# Patient Record
Sex: Female | Born: 1975 | Race: Black or African American | Hispanic: No | Marital: Single | Smoking: Current every day smoker
Health system: Southern US, Community
[De-identification: ages and names within clinical notes are randomized; demographics above are authoritative.]

## PROBLEM LIST (undated history)

## (undated) DIAGNOSIS — I10 Essential (primary) hypertension: Secondary | ICD-10-CM

---

## 2000-11-15 ENCOUNTER — Emergency Department (HOSPITAL_COMMUNITY): Admission: EM | Admit: 2000-11-15 | Discharge: 2000-11-15 | Payer: Self-pay | Admitting: Emergency Medicine

## 2001-08-08 ENCOUNTER — Emergency Department (HOSPITAL_COMMUNITY): Admission: EM | Admit: 2001-08-08 | Discharge: 2001-08-08 | Payer: Self-pay | Admitting: Emergency Medicine

## 2001-12-18 ENCOUNTER — Emergency Department (HOSPITAL_COMMUNITY): Admission: EM | Admit: 2001-12-18 | Discharge: 2001-12-18 | Payer: Self-pay

## 2003-06-06 ENCOUNTER — Emergency Department (HOSPITAL_COMMUNITY): Admission: EM | Admit: 2003-06-06 | Discharge: 2003-06-07 | Payer: Self-pay | Admitting: Emergency Medicine

## 2004-05-14 ENCOUNTER — Emergency Department (HOSPITAL_COMMUNITY): Admission: EM | Admit: 2004-05-14 | Discharge: 2004-05-14 | Payer: Self-pay | Admitting: Emergency Medicine

## 2004-07-24 ENCOUNTER — Inpatient Hospital Stay (HOSPITAL_COMMUNITY): Admission: AD | Admit: 2004-07-24 | Discharge: 2004-07-25 | Payer: Self-pay | Admitting: Obstetrics and Gynecology

## 2005-04-20 ENCOUNTER — Ambulatory Visit (HOSPITAL_COMMUNITY): Admission: RE | Admit: 2005-04-20 | Discharge: 2005-04-20 | Payer: Self-pay | Admitting: *Deleted

## 2005-05-04 ENCOUNTER — Ambulatory Visit: Payer: Self-pay | Admitting: *Deleted

## 2005-05-26 ENCOUNTER — Ambulatory Visit: Payer: Self-pay | Admitting: Gynecology

## 2005-06-23 ENCOUNTER — Ambulatory Visit: Payer: Self-pay | Admitting: Gynecology

## 2005-06-30 ENCOUNTER — Ambulatory Visit (HOSPITAL_COMMUNITY): Admission: RE | Admit: 2005-06-30 | Discharge: 2005-06-30 | Payer: Self-pay | Admitting: *Deleted

## 2005-08-05 ENCOUNTER — Inpatient Hospital Stay (HOSPITAL_COMMUNITY): Admission: AD | Admit: 2005-08-05 | Discharge: 2005-08-08 | Payer: Self-pay | Admitting: Obstetrics & Gynecology

## 2005-08-05 ENCOUNTER — Ambulatory Visit: Payer: Self-pay | Admitting: Obstetrics & Gynecology

## 2005-08-05 ENCOUNTER — Encounter (INDEPENDENT_AMBULATORY_CARE_PROVIDER_SITE_OTHER): Payer: Self-pay | Admitting: *Deleted

## 2005-08-11 ENCOUNTER — Ambulatory Visit: Payer: Self-pay | Admitting: Obstetrics and Gynecology

## 2006-12-23 ENCOUNTER — Emergency Department (HOSPITAL_COMMUNITY): Admission: EM | Admit: 2006-12-23 | Discharge: 2006-12-23 | Payer: Self-pay | Admitting: Emergency Medicine

## 2007-04-15 IMAGING — US US OB COMP LESS 14 WK
1 series · 14 of 19 positions shown · non-contrast
Comparison: none

CLINICAL DATA: 29 year old female 13 weeks pregnant with vaginal bleeding.  
 OBSTETRICAL ULTRASOUND < 14 WEEKS: 
 No comparison. 

  Number of Fetuses:  1
 Heart Rate:  157 bpm
 CRL:  7.5 cm 
 Fetal anatomy could not be evaluated due to the early gestational age.
 MATERNAL UTERINE AND ADNEXAL FINDINGS
 Cervix:  3.4 cm transabdominally.  
 There is an anterior uterine fibroid measuring 7 x 4 x 6 cm.

[Series 1: us ob comp less 14 wk · 0.33mm/px · 14 of 19 slices shown]
[im 1/19]
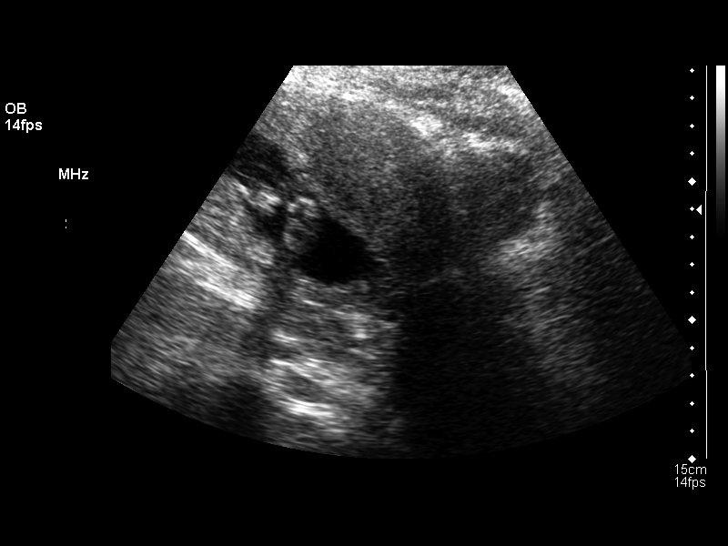
[im 3/19]
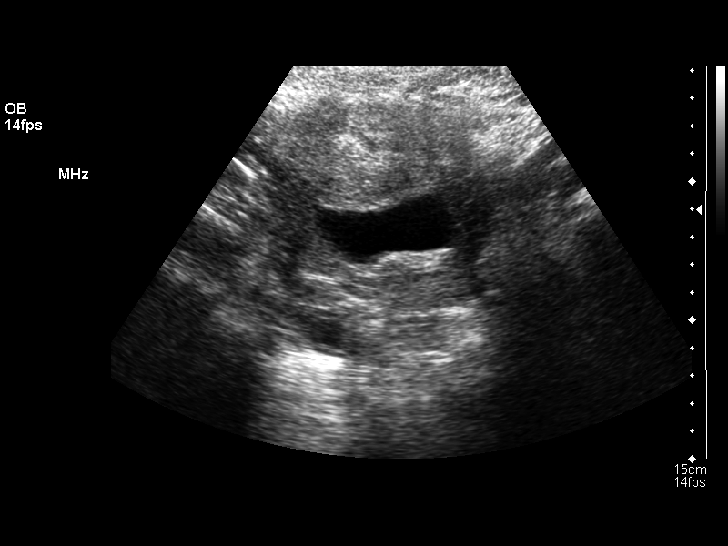
[im 4/19]
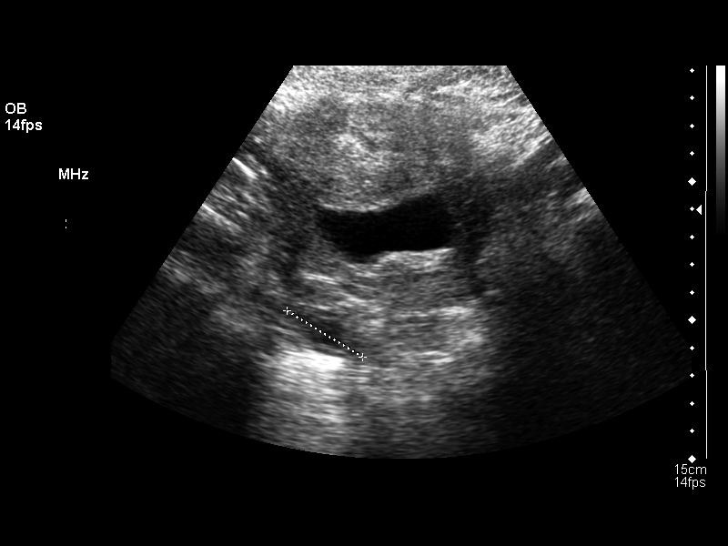
[im 5/19]
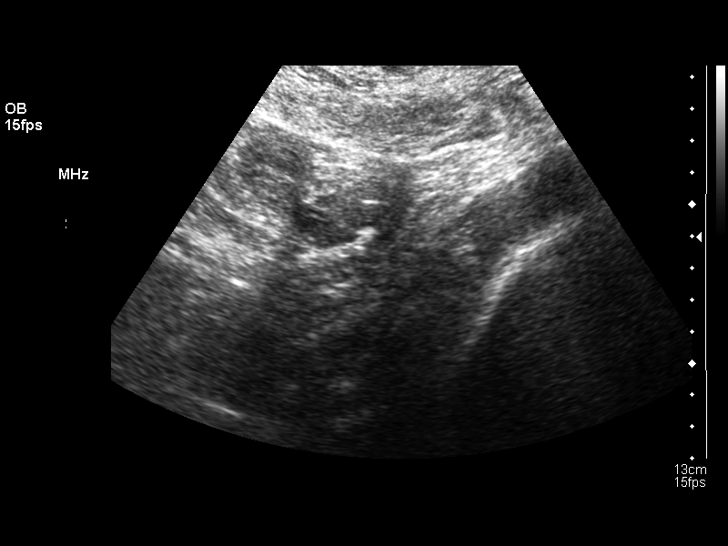
[im 7/19]
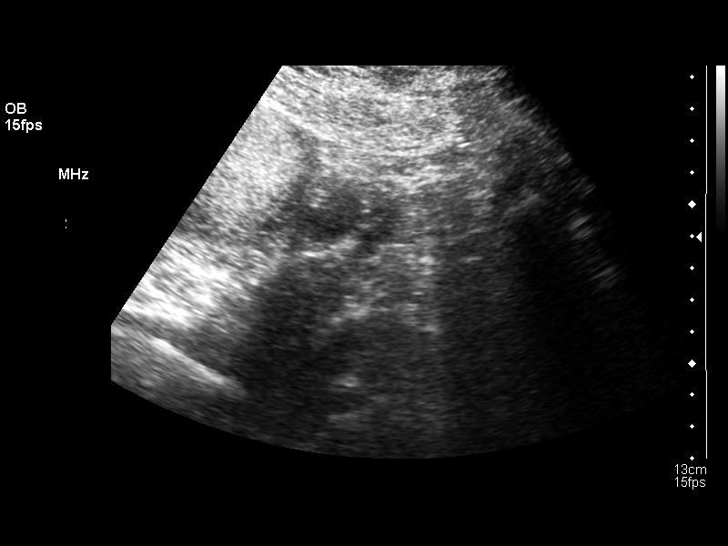
[im 8/19]
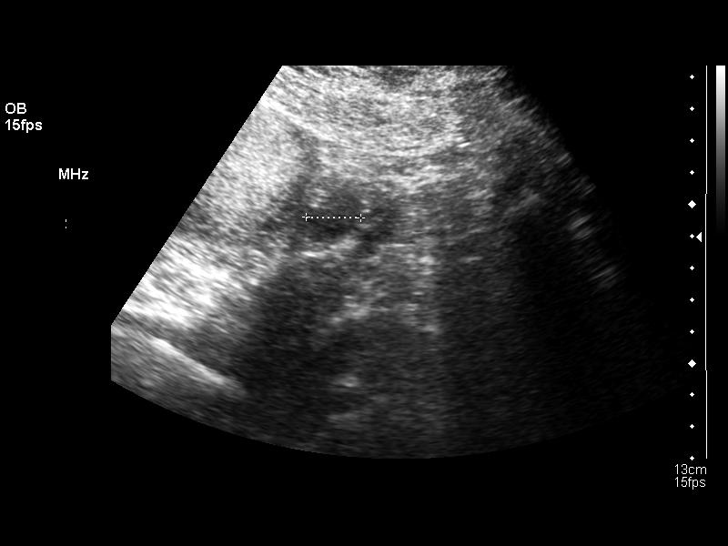
[im 9/19]
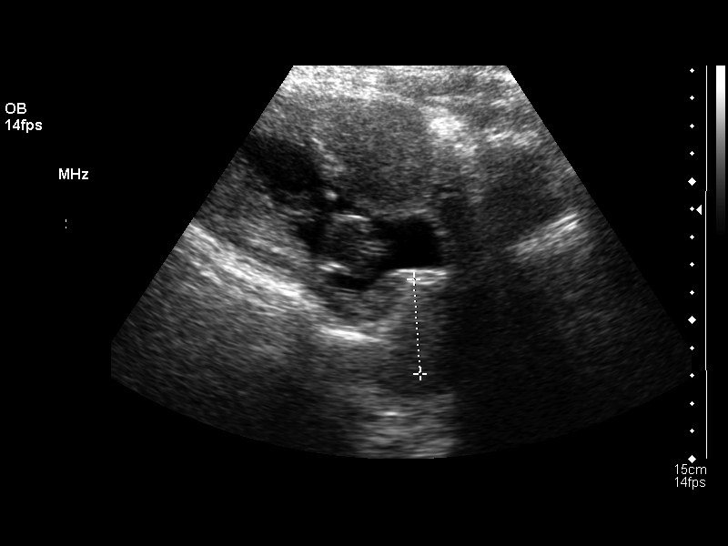
[im 11/19]
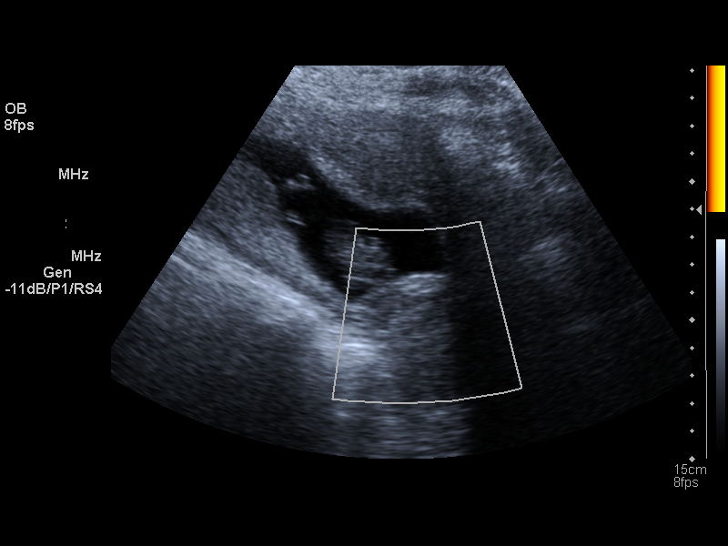
[im 12/19]
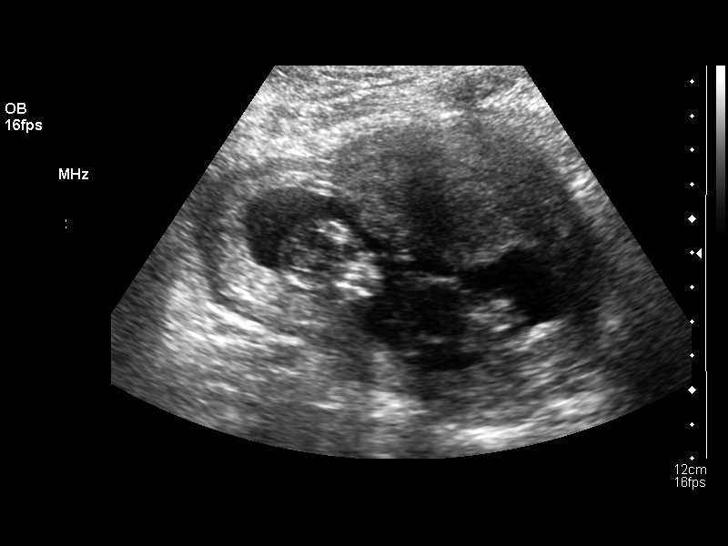
[im 13/19]
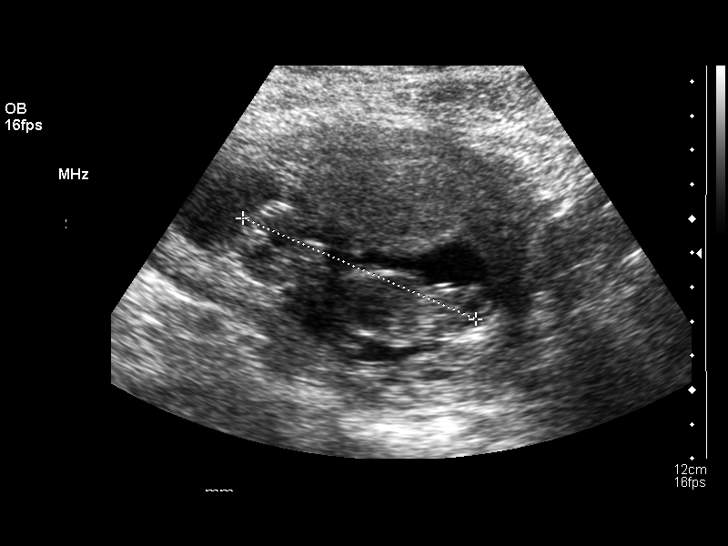
[im 15/19]
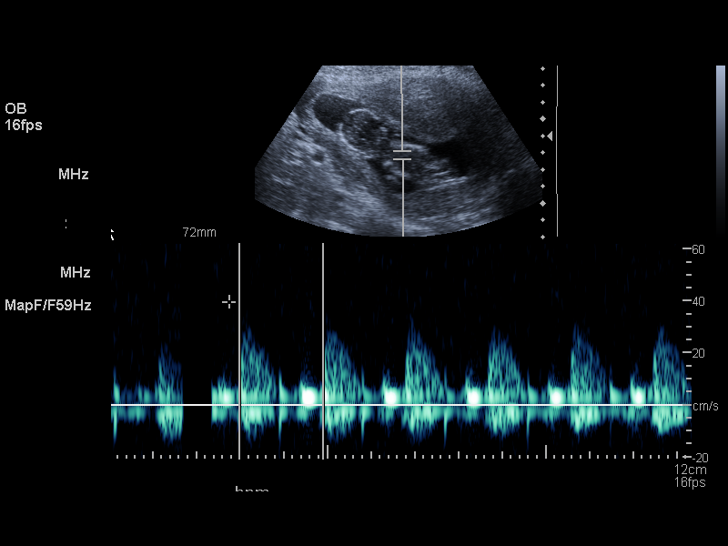
[im 16/19]
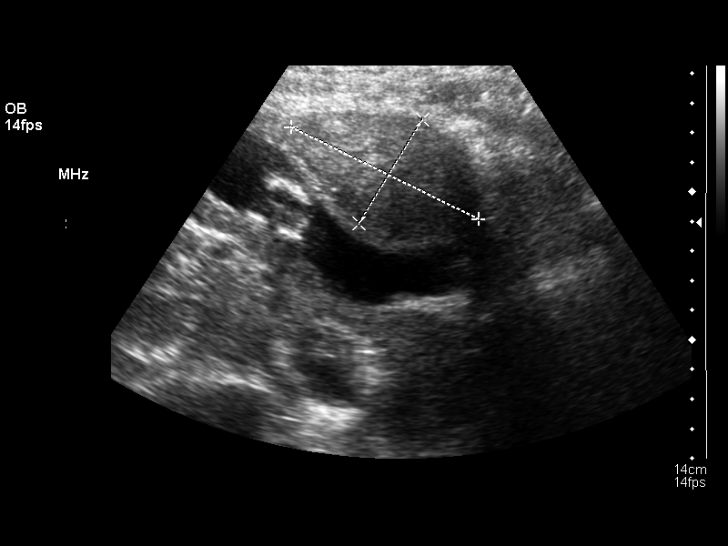
[im 17/19]
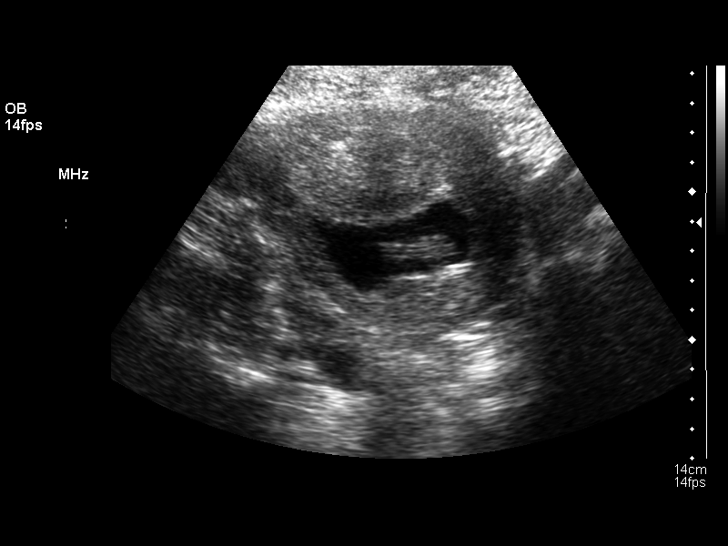
[im 19/19]
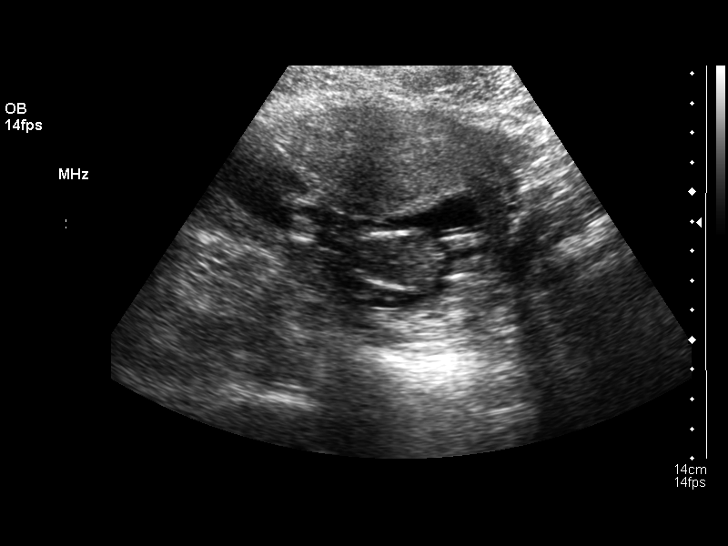

[14 of 19 positions shown; findings below may reference images not displayed]

IMPRESSION: 1.  Viable 13 week, 6 day intrauterine pregnancy.

2.  Anterior uterine fibroid.

3.  Closed cervix with a 3.4 cm length transabdominally.

## 2008-03-20 IMAGING — US US OB FOLLOW-UP EACH ADDL GEST (MODIFY)
2 series · 13 of 28 positions shown · non-contrast
Comparison: none

CLINICAL DATA: Twin gestation with assigned gestational age of 33 weeks 2 days.  Measuring large for dates.  Evaluate twin growth and amniotic fluid volume.

[Series 1: us ob follow-up each addl gest (modify) · 0.35mm/px · 11 of 63 slices shown (1 of 2)]
[im 3/63]
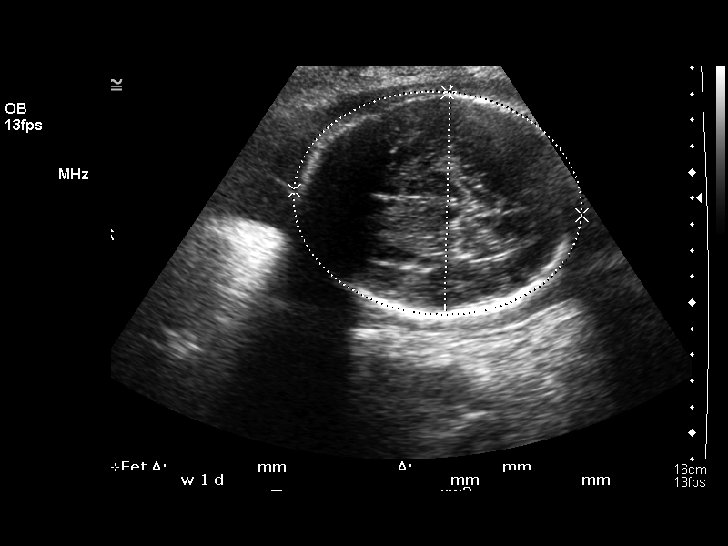
[im 9/63]
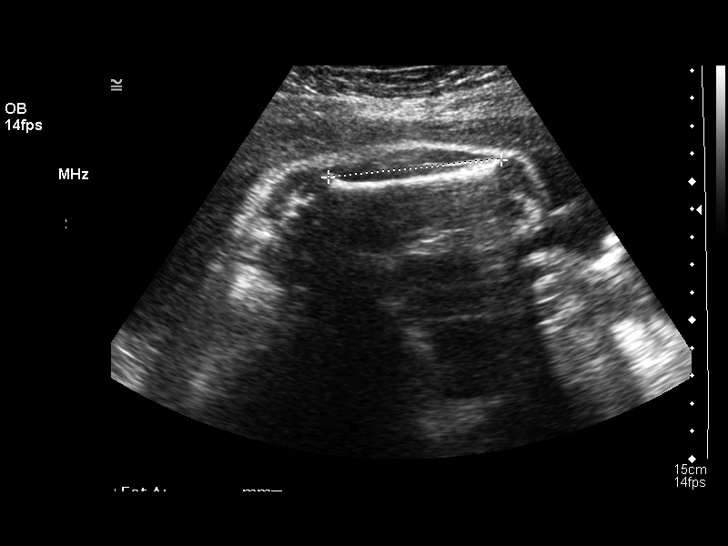
[im 14/63]
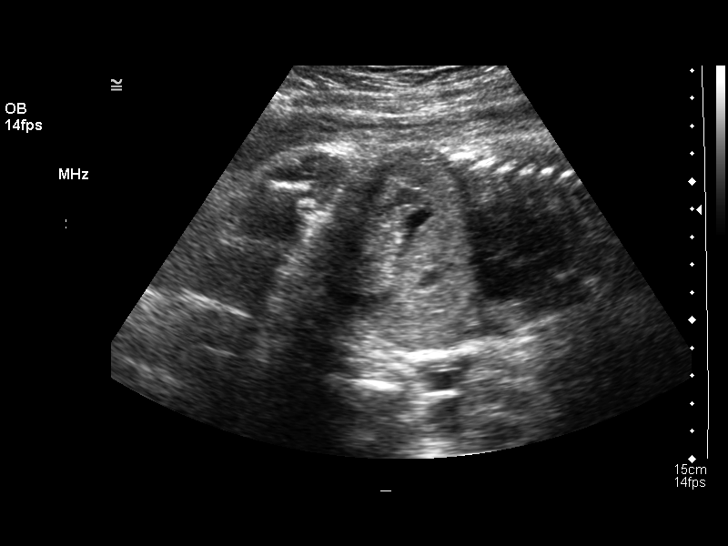
[im 19/63]
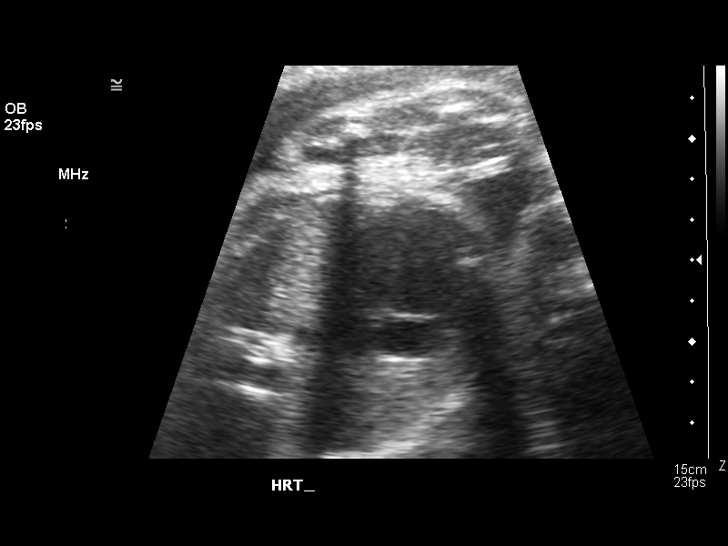
[im 25/63]
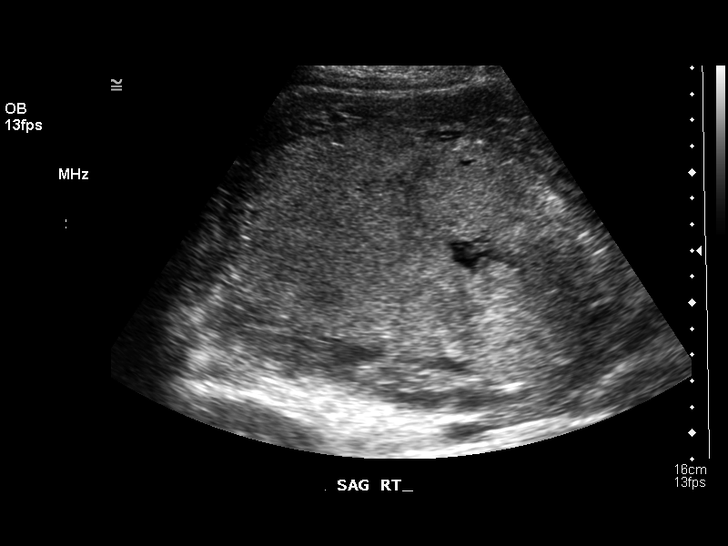
[im 30/63]
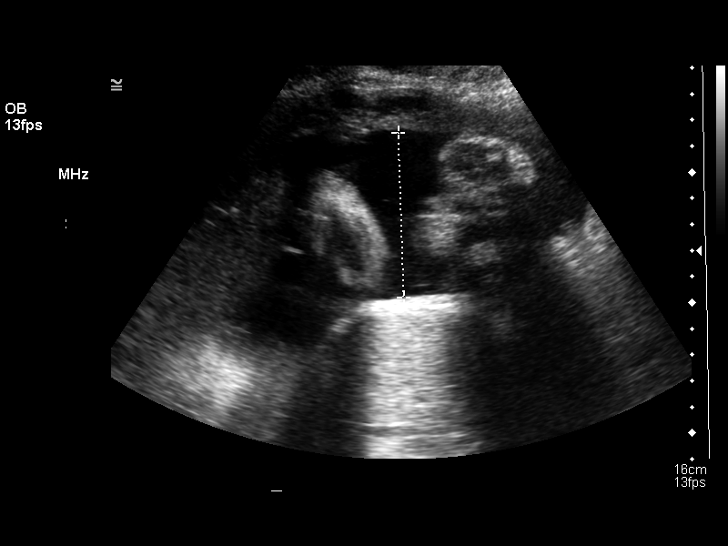
[im 38/63]
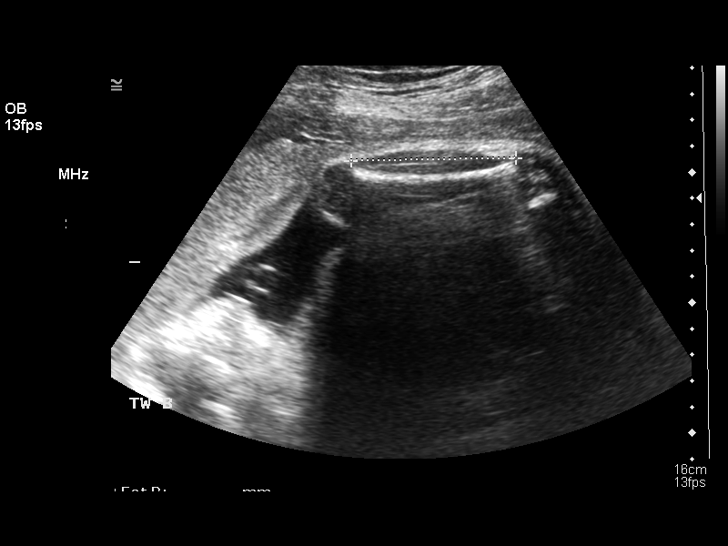
[im 44/63]
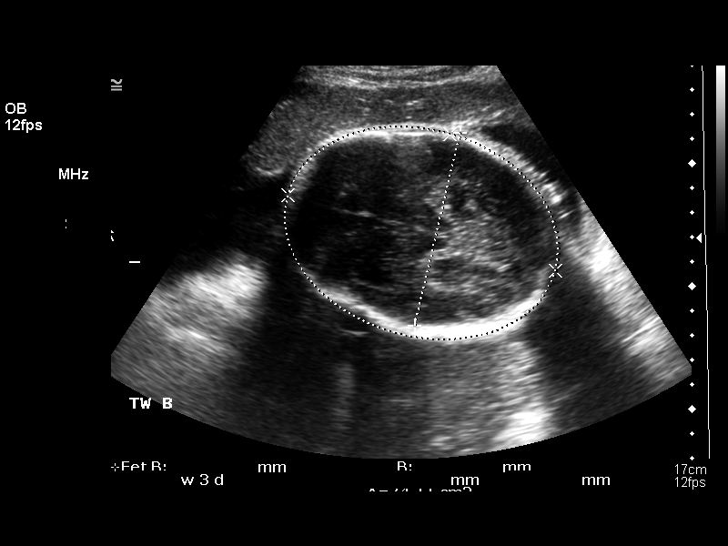
[im 49/63]
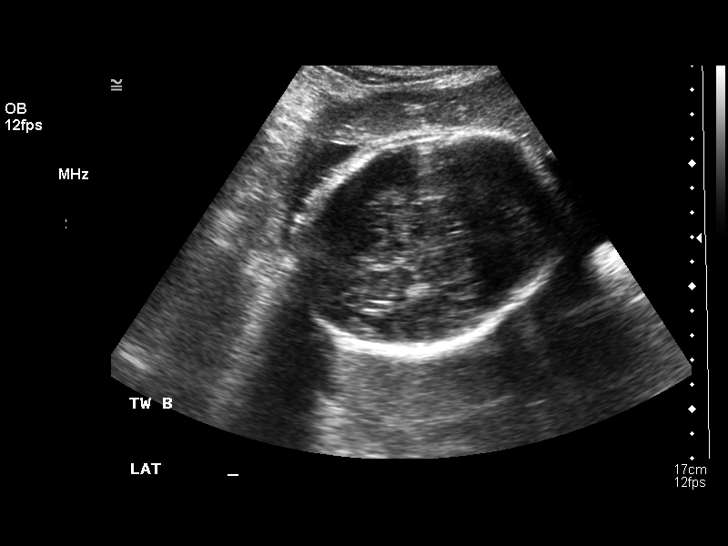
[im 54/63]
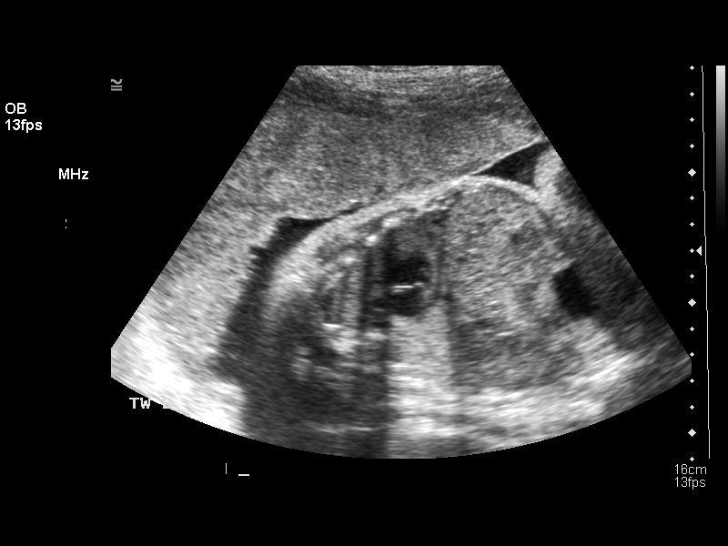
[im 60/63]
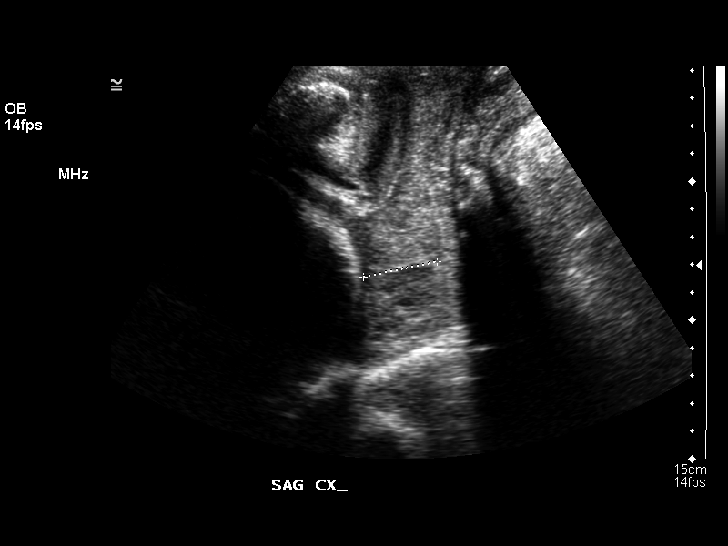

[Series 1: us ob follow-up each addl gest (modify) · 0.35mm/px · 2 of 9 slices shown (2 of 2)]
[im 1/9]
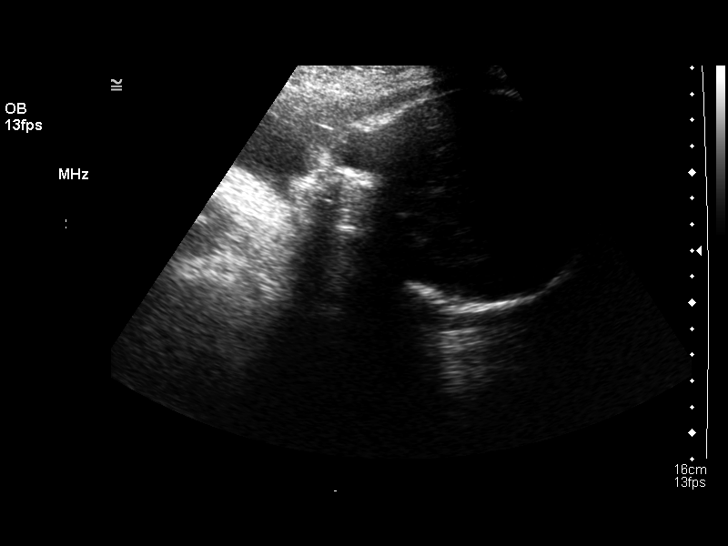
[im 6/9]
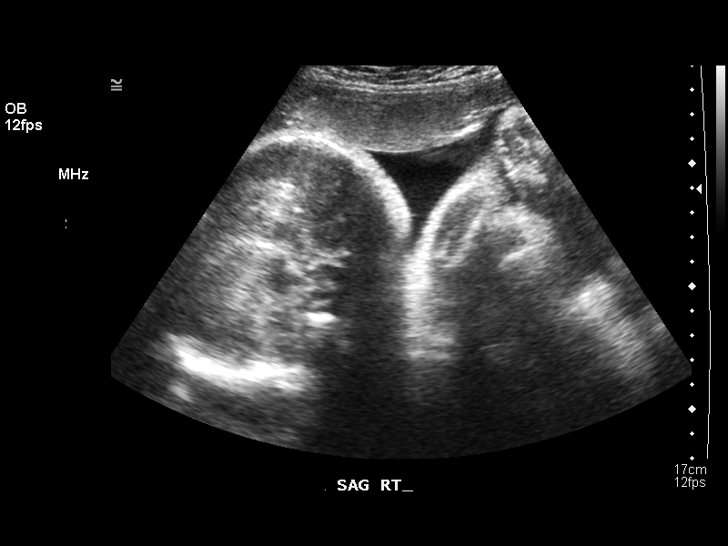

[13 of 28 positions shown; findings below may reference images not displayed]

TWIN OBSTETRICAL ULTRASOUND REEVALUATION:
 A living twin gestation is present.  A separating membrane is seen.

 TWIN A:
 Heart Rate: 157
 Movement:  Yes
 Breathing:  Yes
 Presentation:  Cephalic on the maternal right side
 Placental Location:  Posterior, right lateral 
 Grade:  II
 Previa:  No
 Amniotic Fluid (Subjective):  Normal
 Amniotic Fluid (Objective):   6.3 cm Vertical pocket 

 FETAL BIOMETRY (TWIN A)
 BPD:  8.5 cm  34 w 2 d
 HC:  30.7 cm   34 w 2 d
 AC:   27.6 cm   31 w 5 d
 FL:   6.1 cm   31 w 6 d

 Mean GA:  33 w 0 d  US EDC:  08/18/05
 Assigned GA:  33 w 2 d  Assigned EDC:  08/16/05
 EFW:  3536 g (H) 50th ? 75th%ile (9497 ? 1764 g) For 33 wks

 FETAL ANATOMY (TWIN A)
 Lateral Ventricles:  Visualized 
 Thalami/CSP:  Visualized 
 Posterior Fossa:  Visualized 
 Nuchal Region:  N/A
 Spine:  Previously seen 
 4 Chamber Heart on L:  Visualized 
 Stomach on Left:  Visualized 
 3 Vessel Cord:  Previously seen 
 Cord Insertion Site:  Previously seen 
 Kidneys:  Visualized 
 Bladder:  Visualized 
 Extremities:  Previously seen 

 Additional Anatomy Visualized:  LVOT, diaphragm, and male genitalia.
 TWIN B:
 Heart Rate:  160
 Movement:  Yes
 Breathing:  Yes
 Presentation:  Breech on the maternal left side
 Placental Location:  Anterior
 Grade:  II
 Previa:  No
 Amniotic Fluid (Subjective):  Normal
 Amniotic Fluid (Objective):   5.3 cm Vertical pocket 

 FETAL BIOMETRY (TWIN B)
 BPD:  8.2 cm   33 w 1 d
 HC:  31.3 cm   35 w 0 d
 AC:  28.3 cm   32 w 3 d
 FL:   6.4 cm   32 w 6 d

 Mean GA:  33 w 3 d  US EDC:  08/15/05
 Assigned GA:  33 w 2 d  Assigned EDC:  08/16/05
 EFW:  8483 g (H) 50th ? 75th%ile (9497 ? 1764 g) For 33 wks

 FETAL ANATOMY (TWIN B)
 Lateral Ventricles:  Visualized 
 Thalami/CSP:  Visualized 
 Posterior Fossa:  Previously seen 
 Nuchal Region:  N/A
 Spine:  Previously seen   
 4 Chamber Heart on L:  Visualized 
 Stomach on Left:  Visualized 
 3 Vessel Cord:  Previously seen 
 Cord Insertion Site:  Previously seen 
 Kidneys:  Visualized 
 Bladder:  Visualized 
 Extremities:  Previously seen 

 Additional Anatomy Visualized:  LVOT, RVOT, upper lip, orbits, diaphragm, and female genitalia.

 MATERNAL UTERINE AND ADNEXAL FINDINGS
 Cervix: 3.0 cm Translabially
IMPRESSION: 1.  Living twin gestation with assigned gestational age of 33 weeks 2 days.  Appropriate and concordant twin fetal growth, with EFW for both fetuses at the 50th ? 75th percentile. 
 2.  Normal amniotic fluid volume for both fetuses.  Normal cervical length.

## 2008-05-05 ENCOUNTER — Emergency Department (HOSPITAL_COMMUNITY): Admission: EM | Admit: 2008-05-05 | Discharge: 2008-05-05 | Payer: Self-pay | Admitting: Emergency Medicine

## 2008-05-31 ENCOUNTER — Emergency Department (HOSPITAL_COMMUNITY): Admission: EM | Admit: 2008-05-31 | Discharge: 2008-05-31 | Payer: Self-pay | Admitting: Family Medicine

## 2008-06-11 ENCOUNTER — Emergency Department (HOSPITAL_COMMUNITY): Admission: EM | Admit: 2008-06-11 | Discharge: 2008-06-11 | Payer: Self-pay | Admitting: Family Medicine

## 2008-07-01 ENCOUNTER — Emergency Department (HOSPITAL_COMMUNITY): Admission: EM | Admit: 2008-07-01 | Discharge: 2008-07-01 | Payer: Self-pay | Admitting: Emergency Medicine

## 2008-09-02 ENCOUNTER — Emergency Department (HOSPITAL_COMMUNITY): Admission: EM | Admit: 2008-09-02 | Discharge: 2008-09-02 | Payer: Self-pay | Admitting: Family Medicine

## 2009-05-12 ENCOUNTER — Emergency Department (HOSPITAL_COMMUNITY): Admission: EM | Admit: 2009-05-12 | Discharge: 2009-05-12 | Payer: Self-pay | Admitting: Family Medicine

## 2010-06-21 LAB — POCT I-STAT, CHEM 8
Chloride: 104 mEq/L (ref 96–112)
Hemoglobin: 11.6 g/dL — ABNORMAL LOW (ref 12.0–15.0)
Potassium: 3.7 mEq/L (ref 3.5–5.1)

## 2010-07-30 NOTE — Discharge Summary (Signed)
Jennifer Maldonado, Jennifer Maldonado             ACCOUNT NO.:  [id number]   MEDICAL RECORD NO.:  [id number]          PATIENT TYPE:  INP   LOCATION:  9133                          FACILITY:  [facility]   PHYSICIAN:  Lesly Dukes, M.D. DATE OF BIRTH:  1975/12/21   DATE OF ADMISSION:  08/05/2005  DATE OF DISCHARGE:  08/08/2005                                 DISCHARGE SUMMARY   PRIMARY DISCHARGE DIAGNOSES:  Low cervical transverse cesarean section  secondary to twins.   This is a 35 year old G6, P2-0-3-4 postoperative day three for a repeat low  transverse cesarean section and bilateral tubal ligation.  Is being  discharged home.  The patient tolerated the procedure well and was doing  fine during the postoperative period.  She did not have any postoperative  fever or any signs of infection at time of discharge.   During the procedure patient was given spinal anesthesia.  Two three-vessel  cords were delivered through manual extraction with estimated blood loss of  approximately 500 mL.  The Apgars for baby 1 was 4 at one minute and 8 at  five minutes, for baby 2 was 4 at one minute and 8 at five minutes.  Both  babies were delivered at term at approximately 38-3/7.  Mom is bottle  feeding.  Birth control is bilateral tubal.  Mom's blood type is A+.  She is  rubella non-immune, but will be immunized before she leaves.   The patient's pain has been well controlled, will be sent home with prenatal  vitamins and ibuprofen.  She will return to the MAU approximately six days  after her procedure for complete removal of her staples.     ______________________________  Djibril Glogowski    ______________________________  Lesly Dukes, M.D.    Hadley Pen  D:  08/08/2005  T:  08/08/2005  Job:  [id number]

## 2010-07-30 NOTE — Op Note (Signed)
Jennifer Maldonado, Jennifer Maldonado             ACCOUNT NO.:  [id number]   MEDICAL RECORD NO.:  [id number]          PATIENT TYPE:  INP   LOCATION:  9133                          FACILITY:  [facility]   PHYSICIAN:  Lesly Dukes, M.D. DATE OF BIRTH:  1975/07/03   DATE OF PROCEDURE:  08/05/2005  DATE OF DISCHARGE:                                 OPERATIVE REPORT   PREOPERATIVE DIAGNOSIS:  The patient is a 35 year old G6 P2-0-3-2 at 9  weeks' and 3 days' estimated gestational age with twin IUP and history of 2  prior C-sections as well as multiparity desiring sterility as well as  limited prenatal care.   POSTOPERATIVE DIAGNOSIS:  The patient is a 35 year old G6 P2-0-3-2 at 61  weeks' and 3 days' estimated gestational age with twin IUP and history of 2  prior C-sections as well as multiparity desiring sterility as well as  limited prenatal care.   PROCEDURE:  A repeat low flap transverse cesarean section and bilateral  tubal ligation.   SURGEON:  Dr. Elsie Lincoln   ASSISTANT:  Dr. Tinnie Gens and Dr. Kathlyn Sacramento   ANESTHESIA:  Spinal.   PATHOLOGY:  Placenta.   ESTIMATED BLOOD LOSS:  800.   COMPLICATIONS:  None.   PROCEDURE:  After informed consent was obtained, the patient was taken to  the operating room where spinal anesthesia was found to be adequate.  The  patient was placed in dorsal supine position with a leftward tilt and  prepared and draped in the normal sterile fashion.  A Foley was in the  bladder.  A Pfannenstiel skin incision was made with the scalpel and carried  down to the fascia.  The fascia was incised in the midline and extended  bilaterally.  The superior and inferior aspects of the fascial incision were  grasped with Kocher clamps, tented up, and dissected off sharply and bluntly  from the underlying layers of rectus muscles.  The rectus muscles were  separated in the midline.  The peritoneum was entered bluntly and extended  superiorly and inferiorly with good  visualization of the bladder.  The  bladder blade was inserted.  Attempt was made at making a bladder flap, but  there were a lot of adhesions involving the lower uterine segment and the  bladder.  The uterine segment was then incised with the scalpel in a  transverse fashion, and the incision was extended bluntly.  The head  delivered easily.  Thick meconium was noted and DeLee suction of the nose  and mouth was done.  The rest of the baby's body delivered easily.  The cord  was clamped, cut, and the baby was handed off to the pediatrician.  Cord  blood was sent for type and screen.  Arterial and venous core __________were  sent.  Baby B also delivered vertex, and there was clear fluid.  This was a  baby girl.  Arterial venous cord gases were also sent.  The placenta  delivered manually, was sent to pathology.  The uterus was exteriorized and  cleared of all clots and debris.  The uterine incision was closed with  0  Vicryl in a running locked fashion, and good hemostasis was noted.  Even  there was no bladder flap __________, the bladder was safely out of harm's  way during the repair of the uterine incision.  The intra-abdominal cavity  was copiously irrigated, and the uterus was noted to be hemostatic all  tension inside the abdominal cavity.  The patient felt warm to touch at the  time of the C-section, and the anesthesiologist told us the patient had  100.0 degrees.  Gentamicin and clindamycin was given at this time.  The  patient was not ruptured at the time of C-section, so it is questionable the  etiology of this low-grade fever.  Pediatricians were also made aware that  cord gases were also sent which were noted to have respiratory acidosis.  Details of these cord gases are on the back of the immediate postprocedure  note.  Rectus muscles and peritoneum were noted to be hemostatic.  The  postpartum tubal ligation was performed using Filshie clips.  Good  hemostasis was noted.  The  fascia was closed with 0 Vicryl in a running  fashion.  Subcutaneous tissue was copiously irrigated and noted to be  hemostatic.  Skin was closed with staples.  The patient tolerated the  procedure well.  Sponge, lap, instrument, and needle count correct x2 and  patient went to the recovery room in stable condition.           ______________________________  Lesly Dukes, M.D.     KHL/MEDQ  D:  08/05/2005  T:  08/05/2005  Job:  [id number]

## 2010-12-23 LAB — POCT URINALYSIS DIP (DEVICE)
Bilirubin Urine: NEGATIVE
Glucose, UA: NEGATIVE
Ketones, ur: NEGATIVE
Nitrite: NEGATIVE
Specific Gravity, Urine: 1.015
pH: 8.5 — ABNORMAL HIGH

## 2010-12-23 LAB — GC/CHLAMYDIA PROBE AMP, GENITAL
Chlamydia, DNA Probe: NEGATIVE
GC Probe Amp, Genital: NEGATIVE

## 2010-12-23 LAB — WET PREP, GENITAL

## 2011-10-28 ENCOUNTER — Emergency Department (HOSPITAL_COMMUNITY)
Admission: EM | Admit: 2011-10-28 | Discharge: 2011-10-28 | Disposition: A | Discharge status: Home / Self Care | Payer: Self-pay | Attending: Emergency Medicine | Admitting: Emergency Medicine

## 2011-10-28 ENCOUNTER — Encounter (HOSPITAL_COMMUNITY): Payer: Self-pay | Admitting: *Deleted

## 2011-10-28 DIAGNOSIS — D649 Anemia, unspecified: Secondary | ICD-10-CM | POA: Insufficient documentation

## 2011-10-28 DIAGNOSIS — I10 Essential (primary) hypertension: Secondary | ICD-10-CM | POA: Insufficient documentation

## 2011-10-28 DIAGNOSIS — K649 Unspecified hemorrhoids: Secondary | ICD-10-CM | POA: Insufficient documentation

## 2011-10-28 HISTORY — DX: Essential (primary) hypertension: I10

## 2011-10-28 LAB — CBC
MCH: 21.1 pg — ABNORMAL LOW (ref 26.0–34.0)
MCV: 71.1 fL — ABNORMAL LOW (ref 78.0–100.0)
RBC: 4.54 MIL/uL (ref 3.87–5.11)
WBC: 7.8 10*3/uL (ref 4.0–10.5)

## 2011-10-28 LAB — COMPREHENSIVE METABOLIC PANEL
ALT: 13 U/L (ref 0–35)
AST: 19 U/L (ref 0–37)
Alkaline Phosphatase: 70 U/L (ref 39–117)
Calcium: 8.9 mg/dL (ref 8.4–10.5)
GFR calc Af Amer: >90 mL/min (ref >90)
Glucose, Bld: 82 mg/dL (ref 70–99)
Total Bilirubin: 0.2 mg/dL — ABNORMAL LOW (ref 0.3–1.2)

## 2011-10-28 LAB — OCCULT BLOOD X 1 CARD TO LAB, STOOL: Fecal Occult Bld: POSITIVE

## 2011-10-28 NOTE — ED Notes (Signed)
Pt reports rectal bleeding off and on x 3 weeks. States dark blood when wipes. Also reports out of BP medication.

## 2011-10-31 LAB — POCT I-STAT, CHEM 8
Sodium: 141 mEq/L (ref 135–145)
TCO2: 27 mmol/L (ref 0–100)

## 2011-11-01 NOTE — ED Provider Notes (Signed)
History     CSN: [id number]  Arrival date & time 10/28/11  1437   First MD Initiated Contact with Patient 10/28/11 1617      Chief Complaint  Patient presents with  . Rectal Bleeding     HPI Pt reports rectal bleeding off and on x 3 weeks. States dark blood when wipes. Also reports out of BP medication.  Past Medical History  Diagnosis Date  . Hypertension     History reviewed. No pertinent past surgical history.  No family history on file.  History  Substance Use Topics  . Smoking status: Never Smoker   . Smokeless tobacco: Not on file  . Alcohol Use: No    OB History    Grav Para Term Preterm Abortions TAB SAB Ect Mult Living                  Review of Systems  All other systems reviewed and are negative.    Allergies  Review of patient's allergies indicates no known allergies.  Home Medications   Current Outpatient Rx  Name Route Sig Dispense Refill  . CETIRIZINE HCL 10 MG PO TABS Oral Take 10 mg by mouth daily as needed. For allergies.    Marland Kitchen VILAZODONE HCL 40 MG PO TABS Oral Take 40 mg by mouth daily.      BP 142/93  Pulse 67  Temp 99.6 F (37.6 C) (Oral)  Resp 18  SpO2 99%  Physical Exam  Nursing note and vitals reviewed. Constitutional: She is oriented to person, place, and time. She appears well-developed. No distress.  HENT:  Head: Normocephalic and atraumatic.  Eyes: Pupils are equal, round, and reactive to light.  Neck: Normal range of motion.  Cardiovascular: Normal rate and intact distal pulses.   Pulmonary/Chest: No respiratory distress.  Abdominal: Normal appearance. She exhibits no distension.  Genitourinary:     Musculoskeletal: Normal range of motion.  Neurological: She is alert and oriented to person, place, and time. No cranial nerve deficit.  Skin: Skin is warm and dry. No rash noted.  Psychiatric: She has a normal mood and affect. Her behavior is normal.    ED Course  Procedures (including critical care  time)  Labs Reviewed  CBC - Abnormal; Notable for the following:    Hemoglobin 9.6 (*)     HCT 32.3 (*)     MCV 71.1 (*)     MCH 21.1 (*)     MCHC 29.7 (*)     RDW 17.1 (*)     All other components within normal limits  COMPREHENSIVE METABOLIC PANEL - Abnormal; Notable for the following:    Total Bilirubin 0.2 (*)     All other components within normal limits  POCT I-STAT, CHEM 8 - Abnormal; Notable for the following:    Calcium, Ion 1.29 (*)     All other components within normal limits  OCCULT BLOOD X 1 CARD TO LAB, STOOL  LAB REPORT - SCANNED   No results found.   1. Bleeding hemorrhoids   2. Anemia       MDM          Nelia Shi, MD 11/01/11 2259

## 2012-01-23 ENCOUNTER — Emergency Department (HOSPITAL_COMMUNITY)
Admission: EM | Admit: 2012-01-23 | Discharge: 2012-01-23 | Disposition: A | Discharge status: Home / Self Care | Payer: Medicaid Other | Attending: Emergency Medicine | Admitting: Emergency Medicine

## 2012-01-23 ENCOUNTER — Encounter (HOSPITAL_COMMUNITY): Payer: Self-pay | Admitting: Emergency Medicine

## 2012-01-23 DIAGNOSIS — Z79899 Other long term (current) drug therapy: Secondary | ICD-10-CM | POA: Insufficient documentation

## 2012-01-23 DIAGNOSIS — R51 Headache: Secondary | ICD-10-CM | POA: Insufficient documentation

## 2012-01-23 DIAGNOSIS — H53149 Visual discomfort, unspecified: Secondary | ICD-10-CM | POA: Insufficient documentation

## 2012-01-23 DIAGNOSIS — I10 Essential (primary) hypertension: Secondary | ICD-10-CM | POA: Insufficient documentation

## 2012-01-23 DIAGNOSIS — R110 Nausea: Secondary | ICD-10-CM | POA: Insufficient documentation

## 2012-01-23 MED ORDER — ONDANSETRON HCL 4 MG/2ML IJ SOLN
4.0000 mg | Freq: Once | INTRAMUSCULAR | Status: AC
  Administered 2012-01-23: 4 mg via INTRAVENOUS
  Filled 2012-01-23: qty 2

## 2012-01-23 MED ORDER — PROMETHAZINE HCL 25 MG/ML IJ SOLN
25.0000 mg | Freq: Once | INTRAMUSCULAR | Status: AC
  Administered 2012-01-23: 25 mg via INTRAVENOUS
  Filled 2012-01-23: qty 1

## 2012-01-23 MED ORDER — KETOROLAC TROMETHAMINE 30 MG/ML IJ SOLN
30.0000 mg | Freq: Once | INTRAMUSCULAR | Status: AC
  Administered 2012-01-23: 30 mg via INTRAVENOUS
  Filled 2012-01-23: qty 1

## 2012-01-23 MED ORDER — PROMETHAZINE HCL 25 MG PO TABS: 25.0000 mg | ORAL_TABLET | Freq: Four times a day (QID) | ORAL | Status: DC | PRN

## 2012-01-23 NOTE — ED Notes (Signed)
Pt discharged home via self; pt given and explained discharge instructions; stated understanding; pt stable at time of discharge

## 2012-01-23 NOTE — ED Notes (Signed)
Pt presenting to ed with c/o headache pain x 3 days pt denies nausea and vomiting pt states sensitivity to light and noise. Pt states intermittent dizziness pt denies chest pain at this time

## 2012-01-23 NOTE — ED Provider Notes (Addendum)
History     CSN: [id number]  Arrival date & time 01/23/12  1231   First MD Initiated Contact with Patient 01/23/12 1506      Chief Complaint  Patient presents with  . Headache    (Consider location/radiation/quality/duration/timing/severity/associated sxs/prior treatment) Patient is a 36 y.o. female presenting with headaches. The history is provided by the patient.  Headache  Associated symptoms include nausea. Pertinent negatives include no fever and no vomiting.   36 year old, female, with a history of depression, presents emergency department complaining of headache, with nausea, photophobia and phonophobia.  For the past 3 days.  She denies fevers, chills, or vision, neck pain, weakness, or rash.  She denies pain anywhere else.    Past Medical History  Diagnosis Date  . Hypertension     Past Surgical History  Procedure Date  . Cesarean section     No family history on file.  History  Substance Use Topics  . Smoking status: Never Smoker   . Smokeless tobacco: Not on file  . Alcohol Use: No    OB History    Grav Para Term Preterm Abortions TAB SAB Ect Mult Living                  Review of Systems  Constitutional: Negative for fever and chills.  HENT: Negative for neck pain and neck stiffness.   Eyes: Positive for photophobia.  Respiratory: Negative for cough and chest tightness.   Cardiovascular: Negative for chest pain.  Gastrointestinal: Positive for nausea. Negative for vomiting and abdominal pain.  Genitourinary: Negative for dysuria.  Musculoskeletal: Negative for back pain.  Skin: Negative for rash.  Neurological: Positive for headaches. Negative for weakness.  Hematological: Does not bruise/bleed easily.  Psychiatric/Behavioral: Negative for confusion.  All other systems reviewed and are negative.    Allergies  Review of patient's allergies indicates no known allergies.  Home Medications   Current Outpatient Rx  Name  Route  Sig  Dispense   Refill  . ACETAMINOPHEN 500 MG PO TABS   Oral   Take 1,000 mg by mouth every 6 (six) hours as needed. Headache         . VILAZODONE HCL 40 MG PO TABS   Oral   Take 40 mg by mouth daily.           BP 137/79  Pulse 87  Temp 98.7 F (37.1 C) (Oral)  Resp 20  SpO2 98%  LMP 12/27/2011  Physical Exam  Nursing note and vitals reviewed. Constitutional: She is oriented to person, place, and time. She appears well-developed and well-nourished. No distress.  HENT:  Head: Normocephalic and atraumatic.  Eyes: Conjunctivae normal and EOM are normal. Pupils are equal, round, and reactive to light.  Neck: Normal range of motion. Neck supple.       No nuchal rigidity  Cardiovascular: Normal rate, regular rhythm and intact distal pulses.   Pulmonary/Chest: Effort normal and breath sounds normal. No respiratory distress.  Abdominal: Soft. Bowel sounds are normal. She exhibits no distension. There is no tenderness.  Musculoskeletal: Normal range of motion. She exhibits no edema and no tenderness.  Neurological: She is alert and oriented to person, place, and time. No cranial nerve deficit. Coordination normal.  Skin: Skin is warm and dry.  Psychiatric: Thought content normal.    ED Course  Procedures (including critical care time) headache, with no signs of altered mental status.  Neurological deficit.  Systemic or CNS infection.  There are no tests  indicated.  Labs Reviewed - No data to display No results found.   No diagnosis found.  5:28 PM Ha resolved   MDM  Headache        Cheri Guppy, MD 01/23/12 1521  Cheri Guppy, MD 01/23/12 1729

## 2012-04-30 ENCOUNTER — Other Ambulatory Visit (HOSPITAL_COMMUNITY): Payer: Self-pay | Admitting: Obstetrics

## 2012-04-30 DIAGNOSIS — Z1231 Encounter for screening mammogram for malignant neoplasm of breast: Secondary | ICD-10-CM

## 2012-05-08 ENCOUNTER — Ambulatory Visit (HOSPITAL_COMMUNITY): Payer: Self-pay | Attending: Obstetrics

## 2012-05-18 ENCOUNTER — Ambulatory Visit (HOSPITAL_COMMUNITY): Payer: Self-pay

## 2015-09-13 ENCOUNTER — Ambulatory Visit (HOSPITAL_BASED_OUTPATIENT_CLINIC_OR_DEPARTMENT_OTHER): Payer: Medicaid Other

## 2015-11-03 ENCOUNTER — Ambulatory Visit (HOSPITAL_BASED_OUTPATIENT_CLINIC_OR_DEPARTMENT_OTHER): Payer: Medicaid Other | Attending: Physician Assistant

## 2016-09-05 ENCOUNTER — Emergency Department (HOSPITAL_COMMUNITY)
Admission: EM | Admit: 2016-09-05 | Discharge: 2016-09-05 | Disposition: A | Discharge status: Home / Self Care | Payer: Medicaid Other | Attending: Emergency Medicine | Admitting: Emergency Medicine

## 2016-09-05 ENCOUNTER — Encounter (HOSPITAL_COMMUNITY): Payer: Self-pay

## 2016-09-05 DIAGNOSIS — Z79899 Other long term (current) drug therapy: Secondary | ICD-10-CM | POA: Diagnosis not present

## 2016-09-05 DIAGNOSIS — L089 Local infection of the skin and subcutaneous tissue, unspecified: Secondary | ICD-10-CM

## 2016-09-05 DIAGNOSIS — I10 Essential (primary) hypertension: Secondary | ICD-10-CM | POA: Diagnosis not present

## 2016-09-05 DIAGNOSIS — L03115 Cellulitis of right lower limb: Secondary | ICD-10-CM | POA: Diagnosis not present

## 2016-09-05 DIAGNOSIS — M7989 Other specified soft tissue disorders: Secondary | ICD-10-CM | POA: Diagnosis present

## 2016-09-05 MED ORDER — SULFAMETHOXAZOLE-TRIMETHOPRIM 800-160 MG PO TABS: 1.0000 | ORAL_TABLET | Freq: Two times a day (BID) | ORAL | 0 refills | Status: AC

## 2016-09-05 MED ORDER — CEPHALEXIN 500 MG PO CAPS: 500.0000 mg | ORAL_CAPSULE | Freq: Four times a day (QID) | ORAL | 0 refills | Status: DC

## 2016-09-05 NOTE — ED Triage Notes (Signed)
Per pt she has an area on her leg that has been present for "2 or 3 or 4 days I guess", stated that it started looking like a mosquito bite and progressed. Area is now swollen, a blister is present and the inside is cloudy. Pt states that she used some hydrocortisone cream on it last night and it did not help. She states that nothing has drained from it. Denies fevers.

## 2016-09-05 NOTE — ED Notes (Signed)
See EDP assessment 

## 2016-09-05 NOTE — ED Provider Notes (Signed)
[facility]-EMERGENCY DEPT Provider Note   CSN: [id number] Arrival date & time: 09/05/16  1401  By signing my name below, I, Linna DarnerRussell Turner, attest that this documentation has been prepared under the direction and in the presence of Marissia Blackham, PA-C. Electronically Signed: Linna Darnerussell Turner, Scribe. 09/05/2016. 4:09 PM.  History   Chief Complaint Chief Complaint  Patient presents with  . Abscess   The history is provided by the patient. No language interpreter was used.    HPI Comments: Jennifer Maldonado is a 41 y.o. female who presents to the Emergency Department complaining of a moderate, gradually worsening area of pain, redness, and swelling to the medial right lower leg over the last several days. She notes that the wound is pruritic as well. Patient states the area "started like a mosquito bite" and has gradually increased in size. Patient endorses pain exacerbation with applied pressure. She has applied hydrocortisone cream without relief. She denies fevers, chills, nausea, vomiting, or any other associated symptoms.  Past Medical History:  Diagnosis Date  . Hypertension     There are no active problems to display for this patient.   Past Surgical History:  Procedure Laterality Date  . CESAREAN SECTION      OB History    No data available       Home Medications    Prior to Admission medications   Medication Sig Start Date End Date Taking? Authorizing Provider  acetaminophen (TYLENOL) 500 MG tablet Take 1,000 mg by mouth every 6 (six) hours as needed. Headache    [provider]  promethazine (PHENERGAN) 25 MG tablet Take 1 tablet (25 mg total) by mouth every 6 (six) hours as needed for nausea. 01/23/12   Cheri Guppyaporossi, Jeffrey, MD  Vilazodone HCl (VIIBRYD) 40 MG TABS Take 40 mg by mouth daily.    [provider]    Family History No family history on file.  Social History Social History  Substance Use Topics  . Smoking status: Never Smoker  .  Smokeless tobacco: Not on file  . Alcohol use No     Allergies   Patient has no known allergies.   Review of Systems Review of Systems  Constitutional: Negative for chills and fever.  Gastrointestinal: Negative for nausea and vomiting.  Skin: Positive for wound.   Physical Exam Updated Vital Signs BP 131/79 (BP Location: Left Arm)   Pulse 92   Temp 99.1 F (37.3 C) (Oral)   Resp 16   LMP 08/29/2016   SpO2 94%   Physical Exam  Constitutional: She is oriented to person, place, and time. She appears well-developed and well-nourished. No distress.  HENT:  Head: Normocephalic and atraumatic.  Eyes: Conjunctivae and EOM are normal.  Neck: Neck supple. No tracheal deviation present.  Cardiovascular: Normal rate.   Pulmonary/Chest: Effort normal. No respiratory distress.  Musculoskeletal: Normal range of motion.  Neurological: She is alert and oriented to person, place, and time.  Skin: Skin is warm and dry.     1 cm pustule to the right anterior mid shin with surrounding erythema, swelling, tenderness.  Psychiatric: She has a normal mood and affect. Her behavior is normal.  Nursing note and vitals reviewed.  ED Treatments / Results  Labs (all labs ordered are listed, but only abnormal results are displayed) Labs Reviewed - No data to display  EKG  EKG Interpretation None       Radiology No results found.  Procedures .Marland Kitchen.Incision and Drainage Date/Time: 09/05/2016 4:11 PM Performed by:  Jaynie Crumble Authorized by: Jaynie Crumble   Consent:    Consent obtained:  Verbal   Consent given by:  Patient Location:    Type:  Abscess   Location:  Lower extremity   Lower extremity location:  Leg   Leg location:  R lower leg Pre-procedure details:    Skin preparation:  Betadine and Chloraprep Anesthesia (see MAR for exact dosages):    Anesthesia method:  None Procedure type:    Complexity:  Simple Procedure details:    Incision types:  Single  straight   Incision depth:  Dermal   Wound management:  Probed and deloculated and irrigated with saline   Drainage:  Purulent Post-procedure details:    Patient tolerance of procedure:  Tolerated well, no immediate complications    (including critical care time)  DIAGNOSTIC STUDIES: Oxygen Saturation is 94% on RA, adequate by my interpretation.    COORDINATION OF CARE: 4:09 PM Discussed treatment plan with pt at bedside and pt agreed to plan.  Medications Ordered in ED Medications - No data to display   Initial Impression / Assessment and Plan / ED Course  I have reviewed the triage vital signs and the nursing notes.  Pertinent labs & imaging results that were available during my care of the patient were reviewed by me and considered in my medical decision making (see chart for details).     Patient emergency department with a skin pustule that's on the right lower leg, it is itchy, but also with tenderness and pain around it. I suspect possibly infected insect bite. It was incised and drained with large purulent drainage. Home with Keflex and Bactrim. Warm compresses. Wound care. Follow-up as needed.   Vitals:   09/05/16 1440  BP: 131/79  Pulse: 92  Resp: 16  Temp: 99.1 F (37.3 C)  TempSrc: Oral  SpO2: 94%      Final Clinical Impressions(s) / ED Diagnoses   Final diagnoses:  Skin pustule  Cellulitis of right lower extremity    New Prescriptions New Prescriptions   CEPHALEXIN (KEFLEX) 500 MG CAPSULE    Take 1 capsule (500 mg total) by mouth 4 (four) times daily.   SULFAMETHOXAZOLE-TRIMETHOPRIM (BACTRIM DS,SEPTRA DS) 800-160 MG TABLET    Take 1 tablet by mouth 2 (two) times daily.   I personally performed the services described in this documentation, which was scribed in my presence. The recorded information has been reviewed and is accurate.    Jaynie Crumble, PA-C 09/05/16 1620    Lavera Guise, MD 09/05/16 2216

## 2016-09-05 NOTE — Discharge Instructions (Signed)
Take both antibiotics as prescribed until all gone. Warm compresses or warm soaks several times a day. Bacitracin topically. Follow up as needed.

## 2016-10-17 ENCOUNTER — Encounter (HOSPITAL_COMMUNITY): Payer: Self-pay

## 2016-10-17 ENCOUNTER — Inpatient Hospital Stay (HOSPITAL_COMMUNITY)
Admission: EM | Admit: 2016-10-17 | Discharge: 2016-10-21 | DRG: 871 | Disposition: A | Discharge status: Home / Self Care | Payer: Medicaid Other | Attending: Internal Medicine | Admitting: Internal Medicine

## 2016-10-17 DIAGNOSIS — J810 Acute pulmonary edema: Secondary | ICD-10-CM | POA: Diagnosis not present

## 2016-10-17 DIAGNOSIS — A4151 Sepsis due to Escherichia coli [E. coli]: Principal | ICD-10-CM | POA: Diagnosis present

## 2016-10-17 DIAGNOSIS — R0600 Dyspnea, unspecified: Secondary | ICD-10-CM

## 2016-10-17 DIAGNOSIS — I10 Essential (primary) hypertension: Secondary | ICD-10-CM | POA: Diagnosis not present

## 2016-10-17 DIAGNOSIS — R319 Hematuria, unspecified: Secondary | ICD-10-CM | POA: Diagnosis not present

## 2016-10-17 DIAGNOSIS — D509 Iron deficiency anemia, unspecified: Secondary | ICD-10-CM | POA: Diagnosis present

## 2016-10-17 DIAGNOSIS — A419 Sepsis, unspecified organism: Secondary | ICD-10-CM | POA: Diagnosis present

## 2016-10-17 DIAGNOSIS — N179 Acute kidney failure, unspecified: Secondary | ICD-10-CM | POA: Diagnosis present

## 2016-10-17 DIAGNOSIS — H9202 Otalgia, left ear: Secondary | ICD-10-CM | POA: Diagnosis present

## 2016-10-17 DIAGNOSIS — R918 Other nonspecific abnormal finding of lung field: Secondary | ICD-10-CM

## 2016-10-17 DIAGNOSIS — R3129 Other microscopic hematuria: Secondary | ICD-10-CM | POA: Diagnosis present

## 2016-10-17 DIAGNOSIS — E876 Hypokalemia: Secondary | ICD-10-CM | POA: Diagnosis present

## 2016-10-17 DIAGNOSIS — N390 Urinary tract infection, site not specified: Secondary | ICD-10-CM | POA: Diagnosis present

## 2016-10-17 DIAGNOSIS — E860 Dehydration: Secondary | ICD-10-CM | POA: Diagnosis present

## 2016-10-17 DIAGNOSIS — R7989 Other specified abnormal findings of blood chemistry: Secondary | ICD-10-CM

## 2016-10-17 DIAGNOSIS — F329 Major depressive disorder, single episode, unspecified: Secondary | ICD-10-CM | POA: Diagnosis present

## 2016-10-17 DIAGNOSIS — Z6841 Body Mass Index (BMI) 40.0 and over, adult: Secondary | ICD-10-CM

## 2016-10-17 LAB — URINALYSIS, ROUTINE W REFLEX MICROSCOPIC
BILIRUBIN URINE: NEGATIVE
Glucose, UA: NEGATIVE mg/dL
Ketones, ur: NEGATIVE mg/dL
Nitrite: NEGATIVE
PH: 5 (ref 5.0–8.0)
Protein, ur: 100 mg/dL — AB
SPECIFIC GRAVITY, URINE: 1.023 (ref 1.005–1.030)

## 2016-10-17 LAB — I-STAT BETA HCG BLOOD, ED (MC, WL, AP ONLY): HCG, QUANTITATIVE: 36.2 m[IU]/mL — AB (ref <5)

## 2016-10-17 LAB — COMPREHENSIVE METABOLIC PANEL
ALBUMIN: 3.2 g/dL — AB (ref 3.5–5.0)
ALT: 14 U/L (ref 14–54)
AST: 19 U/L (ref 15–41)
Alkaline Phosphatase: 104 U/L (ref 38–126)
Anion gap: 12 (ref 5–15)
BUN: 16 mg/dL (ref 6–20)
CHLORIDE: 99 mmol/L — AB (ref 101–111)
CO2: 21 mmol/L — ABNORMAL LOW (ref 22–32)
Calcium: 8.8 mg/dL — ABNORMAL LOW (ref 8.9–10.3)
Creatinine, Ser: 1.45 mg/dL — ABNORMAL HIGH (ref 0.44–1.00)
GFR calc Af Amer: 51 mL/min — ABNORMAL LOW (ref >60)
GFR calc non Af Amer: 44 mL/min — ABNORMAL LOW (ref >60)
GLUCOSE: 104 mg/dL — AB (ref 65–99)
POTASSIUM: 3.6 mmol/L (ref 3.5–5.1)
Sodium: 132 mmol/L — ABNORMAL LOW (ref 135–145)
TOTAL PROTEIN: 7.5 g/dL (ref 6.5–8.1)
Total Bilirubin: 0.8 mg/dL (ref 0.3–1.2)

## 2016-10-17 LAB — CBC WITH DIFFERENTIAL/PLATELET
BASOS PCT: 0 %
Basophils Absolute: 0 10*3/uL (ref 0.0–0.1)
EOS ABS: 0 10*3/uL (ref 0.0–0.7)
EOS PCT: 0 %
HEMATOCRIT: 34.3 % — AB (ref 36.0–46.0)
HEMOGLOBIN: 11.2 g/dL — AB (ref 12.0–15.0)
LYMPHS PCT: 5 %
Lymphs Abs: 1 10*3/uL (ref 0.7–4.0)
MCH: 24.1 pg — AB (ref 26.0–34.0)
MCHC: 32.7 g/dL (ref 30.0–36.0)
MCV: 73.9 fL — ABNORMAL LOW (ref 78.0–100.0)
MONOS PCT: 11 %
Monocytes Absolute: 2.2 10*3/uL — ABNORMAL HIGH (ref 0.1–1.0)
Neutro Abs: 17.2 10*3/uL — ABNORMAL HIGH (ref 1.7–7.7)
Neutrophils Relative %: 84 %
Platelets: 285 10*3/uL (ref 150–400)
RBC: 4.64 MIL/uL (ref 3.87–5.11)
RDW: 17.7 % — ABNORMAL HIGH (ref 11.5–15.5)
WBC: 20.4 10*3/uL — ABNORMAL HIGH (ref 4.0–10.5)

## 2016-10-17 LAB — I-STAT CG4 LACTIC ACID, ED
Lactic Acid, Venous: 2.22 mmol/L (ref 0.5–1.9)
Lactic Acid, Venous: 2.35 mmol/L (ref 0.5–1.9)

## 2016-10-17 LAB — CREATININE, URINE, RANDOM: CREATININE, URINE: 553.36 mg/dL

## 2016-10-17 LAB — SODIUM, URINE, RANDOM: SODIUM UR: 42 mmol/L

## 2016-10-17 LAB — HCG, QUANTITATIVE, PREGNANCY: hCG, Beta Chain, Quant, S: 2 m[IU]/mL (ref <5)

## 2016-10-17 LAB — LIPASE, BLOOD: Lipase: 17 U/L (ref 11–51)

## 2016-10-17 LAB — PROCALCITONIN: Procalcitonin: 1.67 ng/mL

## 2016-10-17 LAB — POC URINE PREG, ED: PREG TEST UR: NEGATIVE

## 2016-10-17 LAB — LACTIC ACID, PLASMA: LACTIC ACID, VENOUS: 2.2 mmol/L — AB (ref 0.5–1.9)

## 2016-10-17 MED ORDER — ACETAMINOPHEN 325 MG PO TABS
650.0000 mg | ORAL_TABLET | Freq: Once | ORAL | Status: AC
  Administered 2016-10-17: 650 mg via ORAL
  Filled 2016-10-17: qty 2

## 2016-10-17 MED ORDER — SODIUM CHLORIDE 0.9 % IV BOLUS (SEPSIS)
1000.0000 mL | Freq: Once | INTRAVENOUS | Status: AC
  Administered 2016-10-17: 1000 mL via INTRAVENOUS

## 2016-10-17 MED ORDER — DEXTROSE 5 % IV SOLN
1.0000 g | INTRAVENOUS | Status: DC
  Administered 2016-10-18 – 2016-10-21 (×4): 1 g via INTRAVENOUS
  Filled 2016-10-17 (×4): qty 10

## 2016-10-17 MED ORDER — SODIUM CHLORIDE 0.9 % IV SOLN
INTRAVENOUS | Status: DC
  Administered 2016-10-17: 125 mL/h via INTRAVENOUS
  Administered 2016-10-18 – 2016-10-19 (×2): via INTRAVENOUS

## 2016-10-17 MED ORDER — ACETAMINOPHEN 325 MG PO TABS
ORAL_TABLET | ORAL | Status: AC
  Filled 2016-10-17: qty 2

## 2016-10-17 MED ORDER — DEXTROSE 5 % IV SOLN
1.0000 g | Freq: Once | INTRAVENOUS | Status: AC
  Administered 2016-10-17: 1 g via INTRAVENOUS
  Filled 2016-10-17: qty 10

## 2016-10-17 MED ORDER — ONDANSETRON HCL 4 MG/2ML IJ SOLN
4.0000 mg | Freq: Three times a day (TID) | INTRAMUSCULAR | Status: DC | PRN
  Administered 2016-10-18: 4 mg via INTRAVENOUS
  Filled 2016-10-17: qty 2

## 2016-10-17 MED ORDER — HYDRALAZINE HCL 20 MG/ML IJ SOLN: 5.0000 mg | INTRAMUSCULAR | Status: DC | PRN

## 2016-10-17 MED ORDER — ENOXAPARIN SODIUM 40 MG/0.4ML ~~LOC~~ SOLN
40.0000 mg | SUBCUTANEOUS | Status: DC
  Administered 2016-10-17 – 2016-10-20 (×4): 40 mg via SUBCUTANEOUS
  Filled 2016-10-17 (×4): qty 0.4

## 2016-10-17 MED ORDER — VILAZODONE HCL 40 MG PO TABS
40.0000 mg | ORAL_TABLET | Freq: Every day | ORAL | Status: DC
  Administered 2016-10-18 – 2016-10-19 (×2): 40 mg via ORAL
  Filled 2016-10-17 (×2): qty 1

## 2016-10-17 MED ORDER — ACETAMINOPHEN 325 MG PO TABS
650.0000 mg | ORAL_TABLET | Freq: Four times a day (QID) | ORAL | Status: DC | PRN
  Administered 2016-10-19 – 2016-10-21 (×2): 650 mg via ORAL
  Filled 2016-10-17 (×3): qty 2

## 2016-10-17 MED ORDER — ONDANSETRON HCL 4 MG/2ML IJ SOLN
4.0000 mg | Freq: Once | INTRAMUSCULAR | Status: AC
  Administered 2016-10-17: 4 mg via INTRAVENOUS
  Filled 2016-10-17: qty 2

## 2016-10-17 MED ORDER — SODIUM CHLORIDE 0.9 % IV BOLUS (SEPSIS)
2000.0000 mL | Freq: Once | INTRAVENOUS | Status: AC
  Administered 2016-10-17: 2000 mL via INTRAVENOUS

## 2016-10-17 MED ORDER — ZOLPIDEM TARTRATE 5 MG PO TABS
5.0000 mg | ORAL_TABLET | Freq: Every evening | ORAL | Status: DC | PRN
  Administered 2016-10-18 – 2016-10-20 (×2): 5 mg via ORAL
  Filled 2016-10-17 (×3): qty 1

## 2016-10-17 MED ORDER — ACETAMINOPHEN 325 MG PO TABS
650.0000 mg | ORAL_TABLET | Freq: Once | ORAL | Status: AC | PRN
  Administered 2016-10-17: 650 mg via ORAL

## 2016-10-17 NOTE — H&P (Signed)
History and Physical    Benn MoulderChastity R Deroo ZOX:[id number] DOB: 31-Dec-1975 DOA: 10/17/2016  Referring MD/NP/PA:   PCP: Norm SaltVanstory, Ashley N, PA   Patient coming from:  The patient is coming from home.  At baseline, pt is independent for most of ADL.   Chief Complaint: Fever, chills, increased urinary frequency, difficulty urinating, suprapubic pressure  HPI: Jennifer Maldonado is a 41 y.o. female with medical history significant of hypertension, who presents with fever, chills, increased urinary frequency, difficulty urinating and suprapubic pressure.  Patient states that she has been having fever, chills, increased urinary frequency, difficulty urinating and suprapubic pain and pressure in the past 3 days. She also developed hematuria yesterday. Denies burning on urination. She also has headache, left ear pain but no any ear discharge, hearing loss, runny nose or sore throat. Patient has nausea and vomited twice yesterday morning, currently no nausea, vomiting, diarrhea or abdominal pain. Denies chest pain, cough, shortness of breath, unilateral weakness. No flank pain.  ED Course: pt was found to have positive urinalysis with moderate amount of leukocyte, WBC 20.4, lactic acid 222 2, 2.35, lipase 17, negative pregnancy test, acute renal injury with creatinine 1.45, temperature 103.1, tachycardia, tachypnea, soft blood pressure 76/59. Patient is placed on telemetry bed for observation.   Review of Systems:   General: has fevers, chills, no changes in body weight, has poor appetite, has fatigue HEENT: no blurry vision, hearing changes or sore throat Respiratory: no dyspnea, coughing, wheezing CV: no chest pain, no palpitations GI: has nausea, vomiting, suprapubic abdominal pressure. No diarrhea, constipation GU: Has difficulty urinating, hematuria, and increased urinary frequency. No dysuria or burning on urination. Ext: no leg edema Neuro: no unilateral weakness, numbness, or tingling, no  vision change or hearing loss Skin: no rash, no skin tear. MSK: No muscle spasm, no deformity, no limitation of range of movement in spin Heme: No easy bruising.  Travel history: No recent long distant travel.  Allergy: No Known Allergies  Past Medical History:  Diagnosis Date  . Hypertension     Past Surgical History:  Procedure Laterality Date  . CESAREAN SECTION      Social History:  reports that she has never smoked. She does not have any smokeless tobacco history on file. She reports that she does not drink alcohol or use drugs.  Family History:  Family History  Problem Relation Age of Onset  . Hypertension Mother      Prior to Admission medications   Medication Sig Start Date End Date Taking? Authorizing Provider  acetaminophen (TYLENOL) 500 MG tablet Take 1,000 mg by mouth every 6 (six) hours as needed. Headache    [provider]  cephALEXin (KEFLEX) 500 MG capsule Take 1 capsule (500 mg total) by mouth 4 (four) times daily. 09/05/16   Kirichenko, Lemont Fillersatyana, PA-C  promethazine (PHENERGAN) 25 MG tablet Take 1 tablet (25 mg total) by mouth every 6 (six) hours as needed for nausea. 01/23/12   Cheri Guppyaporossi, Jeffrey, MD  Vilazodone HCl (VIIBRYD) 40 MG TABS Take 40 mg by mouth daily.    [provider]    Physical Exam: Vitals:   10/17/16 1915 10/17/16 1930 10/17/16 2000 10/17/16 2030  BP: 131/68 (!) 145/48 121/75 125/76  Pulse: (!) 102 (!) 101 98 98  Resp:      Temp:      TempSrc:      SpO2: 98% 100% 100% 100%   General: Not in acute distress HEENT:       Eyes:  PERRL, EOMI, no scleral icterus.       ENT: No discharge from the ears and nose, no pharynx injection, no tonsillar enlargement.        Neck: No JVD, no bruit, no mass felt. Heme: No neck lymph node enlargement. Cardiac: S1/S2, RRR, No murmurs, No gallops or rubs. Respiratory: No rales, wheezing, rhonchi or rubs. GI: Soft, nondistended, has mild discomfort in suprapubic area, no rebound pain,  no organomegaly, BS present. GU: has hematuria Ext: No pitting leg edema bilaterally. 2+DP/PT pulse bilaterally. Musculoskeletal: No joint deformities, No joint redness or warmth, no limitation of ROM in spin. Skin: No rashes.  Neuro: Alert, oriented X3, cranial nerves II-XII grossly intact, moves all extremities normally.  Psych: Patient is not psychotic, no suicidal or hemocidal ideation.  Labs on Admission: I have personally reviewed following labs and imaging studies  CBC:  Recent Labs Lab 10/17/16 1618  WBC 20.4*  NEUTROABS 17.2*  HGB 11.2*  HCT 34.3*  MCV 73.9*  PLT 285   Basic Metabolic Panel:  Recent Labs Lab 10/17/16 1618  NA 132*  K 3.6  CL 99*  CO2 21*  GLUCOSE 104*  BUN 16  CREATININE 1.45*  CALCIUM 8.8*   GFR: CrCl cannot be calculated (Unknown ideal weight.). Liver Function Tests:  Recent Labs Lab 10/17/16 1618  AST 19  ALT 14  ALKPHOS 104  BILITOT 0.8  PROT 7.5  ALBUMIN 3.2*    Recent Labs Lab 10/17/16 1618  LIPASE 17   No results for input(s): AMMONIA in the last 168 hours. Coagulation Profile: No results for input(s): INR, PROTIME in the last 168 hours. Cardiac Enzymes: No results for input(s): CKTOTAL, CKMB, CKMBINDEX, TROPONINI in the last 168 hours. BNP (last 3 results) No results for input(s): PROBNP in the last 8760 hours. HbA1C: No results for input(s): HGBA1C in the last 72 hours. CBG: No results for input(s): GLUCAP in the last 168 hours. Lipid Profile: No results for input(s): CHOL, HDL, LDLCALC, TRIG, CHOLHDL, LDLDIRECT in the last 72 hours. Thyroid Function Tests: No results for input(s): TSH, T4TOTAL, FREET4, T3FREE, THYROIDAB in the last 72 hours. Anemia Panel: No results for input(s): VITAMINB12, FOLATE, FERRITIN, TIBC, IRON, RETICCTPCT in the last 72 hours. Urine analysis:    Component Value Date/Time   COLORURINE AMBER (A) 10/17/2016 1656   APPEARANCEUR TURBID (A) 10/17/2016 1656   LABSPEC 1.023 10/17/2016  1656   PHURINE 5.0 10/17/2016 1656   GLUCOSEU NEGATIVE 10/17/2016 1656   HGBUR LARGE (A) 10/17/2016 1656   BILIRUBINUR NEGATIVE 10/17/2016 1656   KETONESUR NEGATIVE 10/17/2016 1656   PROTEINUR 100 (A) 10/17/2016 1656   UROBILINOGEN 0.2 12/23/2006 1925   NITRITE NEGATIVE 10/17/2016 1656   LEUKOCYTESUR MODERATE (A) 10/17/2016 1656   Sepsis Labs: @LABRCNTIP (procalcitonin:4,lacticidven:4) )No results found for this or any previous visit (from the past 240 hour(s)).   Radiological Exams on Admission: No results found.   EKG: Not done in ED, will get one.   Assessment/Plan Principal Problem:   UTI (urinary tract infection) Active Problems:   Hypertension   Sepsis (HCC)   AKI (acute kidney injury) (HCC)   Sepsis due to UTI: pt has positive UA and fever, chills, increased urinary frequency, difficulty urinating and suprapubic pressure, consistent with urinary tract infection. Patient meets criteria for sepsis with tachycardia, tachypnea, fever and leukocytosis. Currently blood pressure is soft, but hemodynamically stable.  -  Place on tele bed for obs -  Ceftriaxone by IV - Follow up results of urine and  blood cx and amend antibiotic regimen if needed per sensitivity results - prn Zofran for nausea - will get Procalcitonin and trend lactic acid levels per sepsis protocol. - IVF: 3L of NS bolus in ED, followed by 125 cc/h   AKI: pt's Cre is 1.45 on admission. Likely due to UTI and prerenal secondary to dehydration. - IVF as above - Check FeNa  - Follow up renal function by BMP  Hx of HTN: not taking meds at home. Bp is soft. -IV hydralazine when necessary  Left ear pain: Etiology is not clear. No drainage. Likely due to fever. -On antibiotics as below -When necessary Tylenol   DVT ppx: SQ Lovenox Code Status: Full code Family Communication: None at bed side.  Disposition Plan:  Anticipate discharge back to previous home environment Consults called:  none Admission  status: Obs / tele    Date of Service 10/17/2016    Lorretta Harp [facility] Pager [phone]  If 7PM-7AM, please contact night-coverage [email or web address] Password [email or web address] 10/17/2016, 8:55 PM

## 2016-10-17 NOTE — ED Triage Notes (Signed)
Pt presents for evaluation of headache/URI symptoms/L ear pain and chills x 2 days. Patient febrile in triage, denies sore throat. No meds PTA.

## 2016-10-17 NOTE — ED Notes (Signed)
I stat lactic results given to Dr. Deretha EmoryZackowski by B. Bing PlumeHaynes, EMT

## 2016-10-17 NOTE — ED Notes (Signed)
Attempted report to 6e

## 2016-10-17 NOTE — ED Provider Notes (Signed)
[facility]-EMERGENCY DEPT Provider Note   CSN: [id number] Arrival date & time: 10/17/16  1007     History   Chief Complaint Chief Complaint  Patient presents with  . URI  . Headache    HPI   Blood pressure (!) 152/95, pulse (!) 120, temperature (!) 100.4 F (38 C), temperature source Oral, resp. rate 20, last menstrual period 09/27/2016, SpO2 100 %.  Jennifer Maldonado is a 41 y.o. female with past medical history skin for hypertension complaining of fever/chills, left otalgia, nonbloody, nonbilious nausea vomiting onset 2 days ago. She reports some intermittent neck pain not made worse with movement. She denies confusion, cough, sinus congestion, headache, abdominal pain, chest pain, palpitations, syncope, rash.   Further discussion with this patient she states that she has a pressure-like sensation when she urinates, she also states that she has the sensation that she needs to urinate but there is an incomplete void with very little urinary output she's been noticing blood in the urine without dysuria, flank pain, vaginal discharge.  Past Medical History:  Diagnosis Date  . Hypertension     Patient Active Problem List   Diagnosis Date Noted  . UTI (urinary tract infection) 10/17/2016  . Sepsis (HCC) 10/17/2016  . Hypertension     Past Surgical History:  Procedure Laterality Date  . CESAREAN SECTION      OB History    No data available       Home Medications    Prior to Admission medications   Medication Sig Start Date End Date Taking? Authorizing Provider  acetaminophen (TYLENOL) 500 MG tablet Take 1,000 mg by mouth every 6 (six) hours as needed. Headache    [provider]  cephALEXin (KEFLEX) 500 MG capsule Take 1 capsule (500 mg total) by mouth 4 (four) times daily. 09/05/16   Kirichenko, Lemont Fillers, PA-C  promethazine (PHENERGAN) 25 MG tablet Take 1 tablet (25 mg total) by mouth every 6 (six) hours as needed for nausea. 01/23/12   Cheri Guppy, MD    Vilazodone HCl (VIIBRYD) 40 MG TABS Take 40 mg by mouth daily.    [provider]    Family History No family history on file.  Social History Social History  Substance Use Topics  . Smoking status: Never Smoker  . Smokeless tobacco: Not on file  . Alcohol use No     Allergies   Patient has no known allergies.   Review of Systems Review of Systems   A complete review of systems was obtained and all systems are negative except as noted in the HPI and PMH.   Physical Exam Updated Vital Signs BP 121/75   Pulse 98   Temp 100.3 F (37.9 C) (Oral)   Resp 20   LMP 09/27/2016 (Within Days)   SpO2 100%   Physical Exam  Constitutional: She is oriented to person, place, and time. She appears well-developed and well-nourished. No distress.  HENT:  Head: Normocephalic and atraumatic.  Right Ear: External ear normal.  Left Ear: External ear normal.  Mouth/Throat: Oropharynx is clear and moist. No oropharyngeal exudate.  No drooling or stridor. Posterior pharynx mildly erythematous no significant tonsillar hypertrophy. No exudate. Soft palate rises symmetrically. No TTP or induration under tongue.   Left-sided tympanic membrane is retracted and erythematous, she has tenderness to palpation along the left frontal sinus, no mastoid tenderness however she does have anterior cervical lymphadenopathy that is tender.  Mild mucosal edema in the nares with scant rhinorrhea.  Eyes: Pupils are equal, round, and reactive to light. Conjunctivae and EOM are normal.  No TTP of maxillary or frontal sinuses  No TTP or induration of temporal arteries bilaterally  Neck: Normal range of motion. Neck supple.  FROM to C-spine. Pt can touch chin to chest without discomfort. No TTP of midline cervical spine.   Cardiovascular: Normal rate, regular rhythm and intact distal pulses.   Pulmonary/Chest: Effort normal and breath sounds normal. No stridor. No respiratory distress. She has no  wheezes. She has no rales. She exhibits no tenderness.  Abdominal: Soft. Bowel sounds are normal. She exhibits no distension and no mass. There is no tenderness. There is no rebound and no guarding.  Genitourinary:  Genitourinary Comments: No CVA tenderness to percussion bilaterally  Musculoskeletal: Normal range of motion. She exhibits no edema or tenderness.  Neurological: She is alert and oriented to person, place, and time. No cranial nerve deficit.  II-Visual fields grossly intact. III/IV/VI-Extraocular movements intact.  Pupils reactive bilaterally. V/VII-Smile symmetric, equal eyebrow raise,  facial sensation intact VIII- Hearing grossly intact IX/X-Normal gag XI-bilateral shoulder shrug XII-midline tongue extension Motor: 5/5 bilaterally with normal tone and bulk Cerebellar: Normal finger-to-nose  and normal heel-to-shin test.   Romberg negative Ambulates with a coordinated gait   Psychiatric: She has a normal mood and affect.  Nursing note and vitals reviewed.    ED Treatments / Results  Labs (all labs ordered are listed, but only abnormal results are displayed) Labs Reviewed  CBC WITH DIFFERENTIAL/PLATELET - Abnormal; Notable for the following:       Result Value   WBC 20.4 (*)    Hemoglobin 11.2 (*)    HCT 34.3 (*)    MCV 73.9 (*)    MCH 24.1 (*)    RDW 17.7 (*)    Neutro Abs 17.2 (*)    Monocytes Absolute 2.2 (*)    All other components within normal limits  COMPREHENSIVE METABOLIC PANEL - Abnormal; Notable for the following:    Sodium 132 (*)    Chloride 99 (*)    CO2 21 (*)    Glucose, Bld 104 (*)    Creatinine, Ser 1.45 (*)    Calcium 8.8 (*)    Albumin 3.2 (*)    GFR calc non Af Amer 44 (*)    GFR calc Af Amer 51 (*)    All other components within normal limits  URINALYSIS, ROUTINE W REFLEX MICROSCOPIC - Abnormal; Notable for the following:    Color, Urine AMBER (*)    APPearance TURBID (*)    Hgb urine dipstick LARGE (*)    Protein, ur 100 (*)     Leukocytes, UA MODERATE (*)    Bacteria, UA MANY (*)    Squamous Epithelial / LPF 0-5 (*)    Non Squamous Epithelial 0-5 (*)    All other components within normal limits  I-STAT BETA HCG BLOOD, ED ([facility], [facility], [facility] ONLY) - Abnormal; Notable for the following:    I-stat hCG, quantitative 36.2 (*)    All other components within normal limits  I-STAT CG4 LACTIC ACID, ED - Abnormal; Notable for the following:    Lactic Acid, Venous 2.22 (*)    All other components within normal limits  I-STAT CG4 LACTIC ACID, ED - Abnormal; Notable for the following:    Lactic Acid, Venous 2.35 (*)    All other components within normal limits  CULTURE, BLOOD (ROUTINE X 2)  CULTURE, BLOOD (ROUTINE X 2)  URINE CULTURE  LIPASE, BLOOD  HCG, QUANTITATIVE, PREGNANCY  POC URINE PREG, ED    EKG  EKG Interpretation None       Radiology No results found.  Procedures Procedures (including critical care time)  Medications Ordered in ED Medications  acetaminophen (TYLENOL) tablet 650 mg (650 mg Oral Given 10/17/16 1121)  sodium chloride 0.9 % bolus 2,000 mL (2,000 mLs Intravenous New Bag/Given 10/17/16 1635)  ondansetron (ZOFRAN) injection 4 mg (4 mg Intravenous Given 10/17/16 1635)  acetaminophen (TYLENOL) tablet 650 mg (650 mg Oral Given 10/17/16 1721)  cefTRIAXone (ROCEPHIN) 1 g in dextrose 5 % 50 mL IVPB (0 g Intravenous Stopped 10/17/16 1956)  acetaminophen (TYLENOL) tablet 650 mg (650 mg Oral Given 10/17/16 2002)     Initial Impression / Assessment and Plan / ED Course  I have reviewed the triage vital signs and the nursing notes.  Pertinent labs & imaging results that were available during my care of the patient were reviewed by me and considered in my medical decision making (see chart for details).     Vitals:   10/17/16 1900 10/17/16 1915 10/17/16 1930 10/17/16 2000  BP:  131/68 (!) 145/48 121/75  Pulse:  (!) 102 (!) 101 98  Resp:      Temp: 100.3 F (37.9 C)     TempSrc: Oral     SpO2:  98% 100%  100%    Medications  acetaminophen (TYLENOL) tablet 650 mg (650 mg Oral Given 10/17/16 1121)  sodium chloride 0.9 % bolus 2,000 mL (2,000 mLs Intravenous New Bag/Given 10/17/16 1635)  ondansetron (ZOFRAN) injection 4 mg (4 mg Intravenous Given 10/17/16 1635)  acetaminophen (TYLENOL) tablet 650 mg (650 mg Oral Given 10/17/16 1721)  cefTRIAXone (ROCEPHIN) 1 g in dextrose 5 % 50 mL IVPB (0 g Intravenous Stopped 10/17/16 1956)  acetaminophen (TYLENOL) tablet 650 mg (650 mg Oral Given 10/17/16 2002)    Arieanna R Ezzard Standingewman is 41 y.o. female presenting with Headache, left-sided otalgia, fever and chills over the course of 2 days. Patient is reporting neck pain however, she has no meningeal signs whatsoever. I do not feel that this is a meningitis. She is febrile and very mildly tachycardic.  Patient with a leukocytosis of 20, her fever continues to rise 203 while in the ED. She is being bolus 2 L of fluid. Urinalysis is consistent with infection, on further discussion with her this she does appear to have UTI like symptoms, no CVA tenderness to percussion to suggest a pyelonephritis. Urine culture pending and patient will be given a gram of Rocephin.  Patient's lactic acid is rising, will need admission, case discussed with [facility] who accepts admission.  This is a shared visit with the attending physician who personally evaluated the patient and agrees with the care plan.     Final Clinical Impressions(s) / ED Diagnoses   Final diagnoses:  Urinary tract infection with hematuria, site unspecified  Essential hypertension  Elevated lactic acid level    New Prescriptions New Prescriptions   No medications on file     Kaylyn Limisciotta, Treshun Wold, PA-C 10/17/16 2027    Mancel BaleWentz, Elliott, MD 10/19/16 1325

## 2016-10-17 NOTE — ED Notes (Signed)
Dr. Deretha EmoryZackowski notified of I stat Lactic result

## 2016-10-17 NOTE — Progress Notes (Signed)
Received report from ED RN. Room ready for patient. Gunda Maqueda Joselita, RN 

## 2016-10-17 NOTE — ED Notes (Signed)
Pt given Malawiturkey sandwich, applesauce, and apple juice.

## 2016-10-18 ENCOUNTER — Encounter (HOSPITAL_COMMUNITY): Payer: Self-pay | Admitting: *Deleted

## 2016-10-18 DIAGNOSIS — H9202 Otalgia, left ear: Secondary | ICD-10-CM | POA: Diagnosis present

## 2016-10-18 DIAGNOSIS — D509 Iron deficiency anemia, unspecified: Secondary | ICD-10-CM | POA: Diagnosis present

## 2016-10-18 DIAGNOSIS — N390 Urinary tract infection, site not specified: Secondary | ICD-10-CM | POA: Diagnosis present

## 2016-10-18 DIAGNOSIS — R0600 Dyspnea, unspecified: Secondary | ICD-10-CM | POA: Diagnosis not present

## 2016-10-18 DIAGNOSIS — E876 Hypokalemia: Secondary | ICD-10-CM | POA: Diagnosis present

## 2016-10-18 DIAGNOSIS — N3001 Acute cystitis with hematuria: Secondary | ICD-10-CM | POA: Diagnosis not present

## 2016-10-18 DIAGNOSIS — J810 Acute pulmonary edema: Secondary | ICD-10-CM | POA: Diagnosis not present

## 2016-10-18 DIAGNOSIS — R918 Other nonspecific abnormal finding of lung field: Secondary | ICD-10-CM | POA: Diagnosis not present

## 2016-10-18 DIAGNOSIS — I517 Cardiomegaly: Secondary | ICD-10-CM | POA: Diagnosis not present

## 2016-10-18 DIAGNOSIS — A4151 Sepsis due to Escherichia coli [E. coli]: Secondary | ICD-10-CM | POA: Diagnosis present

## 2016-10-18 DIAGNOSIS — E860 Dehydration: Secondary | ICD-10-CM | POA: Diagnosis present

## 2016-10-18 DIAGNOSIS — R3129 Other microscopic hematuria: Secondary | ICD-10-CM | POA: Diagnosis present

## 2016-10-18 DIAGNOSIS — F329 Major depressive disorder, single episode, unspecified: Secondary | ICD-10-CM | POA: Diagnosis present

## 2016-10-18 DIAGNOSIS — Z6841 Body Mass Index (BMI) 40.0 and over, adult: Secondary | ICD-10-CM | POA: Diagnosis not present

## 2016-10-18 DIAGNOSIS — N179 Acute kidney failure, unspecified: Secondary | ICD-10-CM | POA: Diagnosis present

## 2016-10-18 DIAGNOSIS — I10 Essential (primary) hypertension: Secondary | ICD-10-CM | POA: Diagnosis present

## 2016-10-18 DIAGNOSIS — A419 Sepsis, unspecified organism: Secondary | ICD-10-CM | POA: Diagnosis not present

## 2016-10-18 LAB — CBC
HEMATOCRIT: 26.4 % — AB (ref 36.0–46.0)
HEMOGLOBIN: 8.4 g/dL — AB (ref 12.0–15.0)
MCH: 22.8 pg — AB (ref 26.0–34.0)
MCHC: 31.8 g/dL (ref 30.0–36.0)
MCV: 71.7 fL — AB (ref 78.0–100.0)
Platelets: 241 10*3/uL (ref 150–400)
RBC: 3.68 MIL/uL — ABNORMAL LOW (ref 3.87–5.11)
RDW: 16.8 % — ABNORMAL HIGH (ref 11.5–15.5)
WBC: 15.2 10*3/uL — ABNORMAL HIGH (ref 4.0–10.5)

## 2016-10-18 LAB — BASIC METABOLIC PANEL
Anion gap: 8 (ref 5–15)
BUN: 11 mg/dL (ref 6–20)
CO2: 22 mmol/L (ref 22–32)
Calcium: 7.6 mg/dL — ABNORMAL LOW (ref 8.9–10.3)
Chloride: 106 mmol/L (ref 101–111)
Creatinine, Ser: 1.1 mg/dL — ABNORMAL HIGH (ref 0.44–1.00)
GFR calc Af Amer: >60 mL/min (ref >60)
GFR calc non Af Amer: >60 mL/min (ref >60)
GLUCOSE: 110 mg/dL — AB (ref 65–99)
POTASSIUM: 3 mmol/L — AB (ref 3.5–5.1)
SODIUM: 136 mmol/L (ref 135–145)

## 2016-10-18 LAB — LACTIC ACID, PLASMA
LACTIC ACID, VENOUS: 1.2 mmol/L (ref 0.5–1.9)
Lactic Acid, Venous: 2 mmol/L (ref 0.5–1.9)
Lactic Acid, Venous: 2.7 mmol/L (ref 0.5–1.9)

## 2016-10-18 LAB — HIV ANTIBODY (ROUTINE TESTING W REFLEX): HIV SCREEN 4TH GENERATION: NONREACTIVE

## 2016-10-18 MED ORDER — BUTALBITAL-APAP-CAFFEINE 50-325-40 MG PO TABS
2.0000 | ORAL_TABLET | Freq: Once | ORAL | Status: AC
  Administered 2016-10-18: 2 via ORAL
  Filled 2016-10-18: qty 2

## 2016-10-18 MED ORDER — POTASSIUM CHLORIDE CRYS ER 20 MEQ PO TBCR
40.0000 meq | EXTENDED_RELEASE_TABLET | Freq: Every day | ORAL | Status: DC
Start: 2016-10-18 — End: 2016-10-20
  Administered 2016-10-18 – 2016-10-19 (×2): 40 meq via ORAL
  Filled 2016-10-18 (×2): qty 2

## 2016-10-18 MED ORDER — SODIUM CHLORIDE 0.9 % IV BOLUS (SEPSIS)
1000.0000 mL | Freq: Once | INTRAVENOUS | Status: AC
  Administered 2016-10-18: 1000 mL via INTRAVENOUS

## 2016-10-18 MED ORDER — TRAMADOL HCL 50 MG PO TABS
100.0000 mg | ORAL_TABLET | Freq: Four times a day (QID) | ORAL | Status: DC
  Administered 2016-10-18 – 2016-10-19 (×5): 100 mg via ORAL
  Filled 2016-10-18 (×6): qty 2

## 2016-10-18 NOTE — Progress Notes (Signed)
PROGRESS NOTE    Jennifer Maldonado  ZOX:[id number] DOB: 1975-05-25 DOA: 10/17/2016 PCP: Norm SaltVanstory, Ashley N, PA  Outpatient Specialists:     Brief Narrative:  41 yr old multigravid female known htn Bipolar Admitted 8/6 with chills fever and sepsis initially felt to have UTI But also felt to have L ear pain representing probbale systemic illness  Sepitc on admit with hypotension--quickly resolved   Assessment & Plan:   Principal Problem:   UTI (urinary tract infection) Active Problems:   Hypertension   Sepsis (HCC)   AKI (acute kidney injury) (HCC)   Sepsis Cause unclear Urinary vs viral upper ear/respiratory  Rx rocephin 1 gm till cutlures back  Cut back NS 125--->75  Follow UC and BC from 8.6   AKI htn Mild hypokalemia--replace with Kdur 40  Creat 1.4--->1.1  Saline 125--->75 as above  Force fluids  Bipolar  Continue Wellbutrin  microscopic hematuria?  Might need work up  ensure off periods when working up as OP  dilutional anemia  Cut back IVF   lovenox inpateint med surg D/c home in 3-4 days  Consultants:   none  Procedures:   none  Antimicrobials:   ceftriaxone    Subjective:  Alert pleasant feels a little better  No n/v/cp Eating and drinking fair No dysuria No strangury Mild cough   Objective: Vitals:   10/17/16 2334 10/18/16 0010 10/18/16 0605 10/18/16 0846  BP:  (!) 142/83 136/66 124/69  Pulse: (!) 103 (!) 111 98 (!) 110  Resp:  19 (!) 21 20  Temp: (!) 100.5 F (38.1 C) 99.1 F (37.3 C) 98.2 F (36.8 C) (!) 101.9 F (38.8 C)  TempSrc: Oral Oral Oral Oral  SpO2: 96% 98% 99% 99%  Weight:  121.6 kg (268 lb 1.6 oz)    Height:  5\' 4"  (1.626 m)      Intake/Output Summary (Last 24 hours) at 10/18/16 0953 Last data filed at 10/18/16 0700  Gross per 24 hour  Intake          4493.33 ml  Output                0 ml  Net          4493.33 ml   Filed Weights   10/18/16 0010  Weight: 121.6 kg (268 lb 1.6 oz)     Examination:  eomi ncat + submandibular LAN, mild parotid swelling No rale sno rhonchi No tenderness in abd No rash No rebound no guard Neuro intact   Data Reviewed: I have personally reviewed following labs and imaging studies  CBC:  Recent Labs Lab 10/17/16 1618 10/18/16 0519  WBC 20.4* 15.2*  NEUTROABS 17.2*  --   HGB 11.2* 8.4*  HCT 34.3* 26.4*  MCV 73.9* 71.7*  PLT 285 241   Basic Metabolic Panel:  Recent Labs Lab 10/17/16 1618 10/18/16 0519  NA 132* 136  K 3.6 3.0*  CL 99* 106  CO2 21* 22  GLUCOSE 104* 110*  BUN 16 11  CREATININE 1.45* 1.10*  CALCIUM 8.8* 7.6*   GFR: Estimated Creatinine Clearance: 87.5 mL/min (A) (by C-G formula based on SCr of 1.1 mg/dL (H)). Liver Function Tests:  Recent Labs Lab 10/17/16 1618  AST 19  ALT 14  ALKPHOS 104  BILITOT 0.8  PROT 7.5  ALBUMIN 3.2*    Recent Labs Lab 10/17/16 1618  LIPASE 17   No results for input(s): AMMONIA in the last 168 hours. Coagulation Profile: No results for input(s): INR,  PROTIME in the last 168 hours. Cardiac Enzymes: No results for input(s): CKTOTAL, CKMB, CKMBINDEX, TROPONINI in the last 168 hours. BNP (last 3 results) No results for input(s): PROBNP in the last 8760 hours. HbA1C: No results for input(s): HGBA1C in the last 72 hours. CBG: No results for input(s): GLUCAP in the last 168 hours. Lipid Profile: No results for input(s): CHOL, HDL, LDLCALC, TRIG, CHOLHDL, LDLDIRECT in the last 72 hours. Thyroid Function Tests: No results for input(s): TSH, T4TOTAL, FREET4, T3FREE, THYROIDAB in the last 72 hours. Anemia Panel: No results for input(s): VITAMINB12, FOLATE, FERRITIN, TIBC, IRON, RETICCTPCT in the last 72 hours. Urine analysis:    Component Value Date/Time   COLORURINE AMBER (A) 10/17/2016 1656   APPEARANCEUR TURBID (A) 10/17/2016 1656   LABSPEC 1.023 10/17/2016 1656   PHURINE 5.0 10/17/2016 1656   GLUCOSEU NEGATIVE 10/17/2016 1656   HGBUR LARGE (A)  10/17/2016 1656   BILIRUBINUR NEGATIVE 10/17/2016 1656   KETONESUR NEGATIVE 10/17/2016 1656   PROTEINUR 100 (A) 10/17/2016 1656   UROBILINOGEN 0.2 12/23/2006 1925   NITRITE NEGATIVE 10/17/2016 1656   LEUKOCYTESUR MODERATE (A) 10/17/2016 1656   Sepsis Labs: @LABRCNTIP (procalcitonin:4,lacticidven:4)  )No results found for this or any previous visit (from the past 240 hour(s)).       Radiology Studies: No results found.      Scheduled Meds: . enoxaparin (LOVENOX) injection  40 mg Subcutaneous Q24H  . traMADol  100 mg Oral Q6H  . Vilazodone HCl  40 mg Oral Daily   Continuous Infusions: . sodium chloride 125 mL/hr at 10/18/16 0527  . cefTRIAXone (ROCEPHIN) IVPB 1 gram/50 mL D5W       LOS: 0 days    Time spent: 54    Pleas Koch, MD [facility] ([facility]   If 7PM-7AM, please contact night-coverage [email or web address] Password [email or web address] 10/18/2016, 9:53 AM

## 2016-10-18 NOTE — Progress Notes (Signed)
New Admission Note:   Arrival Method: Via stretcher from ED Mental Orientation: Alert and oriented x4 Telemetry: N/A Assessment: Completed Skin: See docflowsheet IV: L FA-NS @125  Pain: See doc flowsheet Tubes: N/A Safety Measures: Safety Fall Prevention Plan has been given, discussed and signed Admission: Completed [address] Orientation: Patient has been orientated to the room, unit and staff.  Family: None at bedside  Orders have been reviewed and implemented. Will continue to monitor the patient. Call light has been placed within reach and bed alarm has been activated.   Toll BrothersCharito Din Bookwalter BSN, RN-BC Phone number: [phone]

## 2016-10-18 NOTE — Progress Notes (Signed)
CRITICAL VALUE ALERT  Critical Value: Lactic acid=2.7  Date & Time Notied:  10/18/2016  01:02  Provider Notified: Kirtland BouchardK Schorr  Orders Received/Actions taken: Orders received.

## 2016-10-19 ENCOUNTER — Inpatient Hospital Stay (HOSPITAL_COMMUNITY): Payer: Medicaid Other

## 2016-10-19 DIAGNOSIS — R0600 Dyspnea, unspecified: Secondary | ICD-10-CM

## 2016-10-19 DIAGNOSIS — I10 Essential (primary) hypertension: Secondary | ICD-10-CM

## 2016-10-19 DIAGNOSIS — N179 Acute kidney failure, unspecified: Secondary | ICD-10-CM

## 2016-10-19 DIAGNOSIS — N3001 Acute cystitis with hematuria: Secondary | ICD-10-CM

## 2016-10-19 DIAGNOSIS — A419 Sepsis, unspecified organism: Secondary | ICD-10-CM

## 2016-10-19 DIAGNOSIS — N390 Urinary tract infection, site not specified: Secondary | ICD-10-CM

## 2016-10-19 LAB — CBC
HEMATOCRIT: 28.9 % — AB (ref 36.0–46.0)
HEMOGLOBIN: 9.2 g/dL — AB (ref 12.0–15.0)
MCH: 23 pg — ABNORMAL LOW (ref 26.0–34.0)
MCHC: 31.8 g/dL (ref 30.0–36.0)
MCV: 72.3 fL — ABNORMAL LOW (ref 78.0–100.0)
Platelets: 312 10*3/uL (ref 150–400)
RBC: 4 MIL/uL (ref 3.87–5.11)
RDW: 17.5 % — AB (ref 11.5–15.5)
WBC: 17.7 10*3/uL — AB (ref 4.0–10.5)

## 2016-10-19 LAB — BASIC METABOLIC PANEL
ANION GAP: 7 (ref 5–15)
BUN: 6 mg/dL (ref 6–20)
CALCIUM: 8.1 mg/dL — AB (ref 8.9–10.3)
CO2: 24 mmol/L (ref 22–32)
Chloride: 105 mmol/L (ref 101–111)
Creatinine, Ser: 0.85 mg/dL (ref 0.44–1.00)
GFR calc non Af Amer: >60 mL/min (ref >60)
Glucose, Bld: 94 mg/dL (ref 65–99)
POTASSIUM: 3.4 mmol/L — AB (ref 3.5–5.1)
Sodium: 136 mmol/L (ref 135–145)

## 2016-10-19 MED ORDER — BUPROPION HCL ER (SR) 150 MG PO TB12
150.0000 mg | ORAL_TABLET | Freq: Two times a day (BID) | ORAL | Status: DC
  Administered 2016-10-19 – 2016-10-21 (×4): 150 mg via ORAL
  Filled 2016-10-19 (×6): qty 1

## 2016-10-19 MED ORDER — ALBUTEROL SULFATE (2.5 MG/3ML) 0.083% IN NEBU
2.5000 mg | INHALATION_SOLUTION | RESPIRATORY_TRACT | Status: DC | PRN
  Filled 2016-10-19 (×2): qty 3

## 2016-10-19 NOTE — Progress Notes (Addendum)
PROGRESS NOTE   Jennifer Maldonado  TIW:[id number]    DOB: May 12, 1975    DOA: 10/17/2016  PCP: Trey Sailors, PA   I have briefly reviewed patients previous medical records in [facility].  Brief Narrative:  41 year old female with PMH of HTN, presented with fever, chills, increased urinary frequency, foul-smelling urine, difficulty urinating and suprapubic pressure. She also noted trace hematuria on day prior to admission and started menstruating the day after admission. She reported left-sided headache and pain underneath the left ear without drainage, tinnitus or hearing loss. No sore throat. Admitted for possible UTI. Improving.   Assessment & Plan:   Principal Problem:   UTI (urinary tract infection) Active Problems:   Hypertension   Sepsis ([place])   AKI (acute kidney injury) ([place])   1. Sepsis due to acute lower UTI: Met sepsis criteria on admission including fever of 103, mild tachycardia, positive UA and leukocytosis. Treated per sepsis protocol with IV fluids and started empirically on IV ceftriaxone. Blood cultures 2: Negative to date. Urine culture pending. No chest x-ray done on admission. Reported dyspnea on 8/8 and chest x-ray reveals bilateral pulmonary infiltrates including the lower lobes and probably the right middle lobe. No other respiratory symptoms i.e. cough, runny nose.? Pneumonia versus others. 2. Acute lower UTI: IV Rocephin pending urine culture results. 3. ? Pneumonia: No respiratory symptoms on admission. Reported dyspnea on 8/8. Chest x-ray shows bilateral pulmonary infiltrates including the lower lobes and probably the right middle lobe. Continue IV Rocephin. Incentive spirometry. Trend pro calcitonin.? Cardiomegaly on chest x-ray. Check 2-D echo. 4. Hypokalemia: Replace and follow. Microcytic anemia: Stable. 5. Acute kidney injury: Presented with creatinine of 1.45. Likely due to UTI, dehydration and sepsis. Resolved. 6. Essential hypertension: On  amlodipine 10 MG daily at home. Currently controlled without medications, monitor off of medications for now. 7. Left earache: Resolved. Unclear etiology. 8. Microscopic hematuria: Reported some hematuria day prior to admission but started menstruating shortly thereafter.? Related to menstrual bleeding versus other. Outpatient evaluation as deemed necessary. 9. Microcytic anemia: Stable. 10. Depression: Patient states that she does not take Viibryd> DC'ed. Continue prior home dose of Wellbutrin. Stable.   DVT prophylaxis: Lovenox Code Status: Full Family Communication: None at bedside Disposition: DC home when medically improved.   Consultants:  None   Procedures:  None  Antimicrobials:  IV ceftriaxone    Subjective: Feels much better. Urine color has normalized and no foul-smelling. No abdominal pain. Left earache has resolved. Left-sided head/facial pain has significantly improved. Reports some dyspnea on exertion without cough.   ROS: No sore throat, nose drainage. Menstruating currently.  Objective:  Vitals:   10/18/16 1649 10/18/16 2255 10/19/16 0452 10/19/16 1000  BP: 135/77 124/72 125/89 134/78  Pulse: 89 83 93 (!) 102  Resp: '20 20 20 19  ' Temp: 98.4 F (36.9 C) 98.6 F (37 C) 99.2 F (37.3 C) 98.6 F (37 C)  TempSrc: Oral Oral Oral Oral  SpO2: 99% 99% 94% 93%  Weight:  122.2 kg (269 lb 8 oz)    Height:        Examination:  General exam: Pleasant young female, moderately built and obese, lying comfortably propped up in bed. Neck: No cervical or submandibular lymphadenopathy appreciated. Respiratory system: Slightly diminished breath sounds in the bases with few basal crackles. No wheezing or rhonchi. Rest of lung fields clear to auscultation. Respiratory effort normal. Cardiovascular system: S1 & S2 heard, RRR. No JVD, murmurs, rubs, gallops or clicks. No pedal  edema. Gastrointestinal system: Abdomen is nondistended, soft and nontender. No organomegaly or  masses felt. Normal bowel sounds heard. Central nervous system: Alert and oriented. No focal neurological deficits. Extremities: Symmetric 5 x 5 power. Skin: No rashes, lesions or ulcers Psychiatry: Judgement and insight appear normal. Mood & affect appropriate.     Data Reviewed: I have personally reviewed following labs and imaging studies  CBC:  Recent Labs Lab 10/17/16 1618 10/18/16 0519 10/19/16 0720  WBC 20.4* 15.2* 17.7*  NEUTROABS 17.2*  --   --   HGB 11.2* 8.4* 9.2*  HCT 34.3* 26.4* 28.9*  MCV 73.9* 71.7* 72.3*  PLT 285 241 160   Basic Metabolic Panel:  Recent Labs Lab 10/17/16 1618 10/18/16 0519 10/19/16 0720  NA 132* 136 136  K 3.6 3.0* 3.4*  CL 99* 106 105  CO2 21* 22 24  GLUCOSE 104* 110* 94  BUN '16 11 6  ' CREATININE 1.45* 1.10* 0.85  CALCIUM 8.8* 7.6* 8.1*   Liver Function Tests:  Recent Labs Lab 10/17/16 1618  AST 19  ALT 14  ALKPHOS 104  BILITOT 0.8  PROT 7.5  ALBUMIN 3.2*     Recent Results (from the past 240 hour(s))  Blood culture (routine x 2)     Status: None (Preliminary result)   Collection Time: 10/17/16  5:38 PM  Result Value Ref Range Status   Specimen Description BLOOD BLOOD LEFT FOREARM  Final   Special Requests IN PEDIATRIC BOTTLE Blood Culture adequate volume  Final   Culture NO GROWTH 2 DAYS  Final   Report Status PENDING  Incomplete  Blood culture (routine x 2)     Status: None (Preliminary result)   Collection Time: 10/17/16  6:20 PM  Result Value Ref Range Status   Specimen Description BLOOD LEFT ANTECUBITAL  Final   Special Requests   Final    BOTTLES DRAWN AEROBIC AND ANAEROBIC Blood Culture adequate volume   Culture NO GROWTH 2 DAYS  Final   Report Status PENDING  Incomplete         Radiology Studies: Dg Chest 2 View  Result Date: 10/19/2016 CLINICAL DATA:  Dyspnea. EXAM: CHEST  2 VIEW COMPARISON:  None FINDINGS: There are hazy bilateral lower lobe pulmonary infiltrates. There is also a hazy infiltrate  seen anteriorly on the lateral view which may be in the medial segment of the right middle lobe. No discrete effusions. Heart size and vascularity are within normal limits. IMPRESSION: Bilateral pulmonary infiltrates including the lower lobes and probably the right middle lobe. Electronically Signed   By: Lorriane Shire M.D.   On: 10/19/2016 10:52        Scheduled Meds: . enoxaparin (LOVENOX) injection  40 mg Subcutaneous Q24H  . potassium chloride  40 mEq Oral Daily  . traMADol  100 mg Oral Q6H  . Vilazodone HCl  40 mg Oral Daily   Continuous Infusions: . cefTRIAXone (ROCEPHIN) IVPB 1 gram/50 mL D5W Stopped (10/19/16 1023)     LOS: 1 day     HONGALGI,ANAND, MD, FACP, FHM. [facility] Pager [phone]  If 7PM-7AM, please contact night-coverage [email or web address] Password [email or web address] 10/19/2016, 1:37 PM

## 2016-10-20 ENCOUNTER — Inpatient Hospital Stay (HOSPITAL_COMMUNITY): Payer: Medicaid Other

## 2016-10-20 DIAGNOSIS — I517 Cardiomegaly: Secondary | ICD-10-CM

## 2016-10-20 DIAGNOSIS — A4151 Sepsis due to Escherichia coli [E. coli]: Principal | ICD-10-CM

## 2016-10-20 LAB — BASIC METABOLIC PANEL
ANION GAP: 9 (ref 5–15)
BUN: 5 mg/dL — ABNORMAL LOW (ref 6–20)
CALCIUM: 7.7 mg/dL — AB (ref 8.9–10.3)
CO2: 23 mmol/L (ref 22–32)
CREATININE: 0.66 mg/dL (ref 0.44–1.00)
Chloride: 104 mmol/L (ref 101–111)
Glucose, Bld: 100 mg/dL — ABNORMAL HIGH (ref 65–99)
Potassium: 3.3 mmol/L — ABNORMAL LOW (ref 3.5–5.1)
SODIUM: 136 mmol/L (ref 135–145)

## 2016-10-20 LAB — CBC
HEMATOCRIT: 25.9 % — AB (ref 36.0–46.0)
Hemoglobin: 8.3 g/dL — ABNORMAL LOW (ref 12.0–15.0)
MCH: 22.5 pg — ABNORMAL LOW (ref 26.0–34.0)
MCHC: 32 g/dL (ref 30.0–36.0)
MCV: 70.2 fL — ABNORMAL LOW (ref 78.0–100.0)
PLATELETS: 312 10*3/uL (ref 150–400)
RBC: 3.69 MIL/uL — ABNORMAL LOW (ref 3.87–5.11)
RDW: 16.9 % — AB (ref 11.5–15.5)
WBC: 14.7 10*3/uL — AB (ref 4.0–10.5)

## 2016-10-20 LAB — ECHOCARDIOGRAM COMPLETE
AOASC: 32 cm
EWDT: 183 ms
FS: 33 % (ref 28–44)
HEIGHTINCHES: 64 in
IV/PV OW: 0.77
LA diam index: 1.65 cm/m2
LA vol A4C: 50.5 ml
LASIZE: 40 mm
LAVOL: 70.3 mL
LAVOLIN: 29 mL/m2
LEFT ATRIUM END SYS DIAM: 40 mm
LV PW d: 13 mm — AB (ref 0.6–1.1)
LV TDI E'MEDIAL: 10.8
LVELAT: 14.5 cm/s
LVOT area: 3.14 cm2
LVOT diameter: 20 mm
Lateral S' vel: 18.2 cm/s
MV Dec: 183
MV pk E vel: 1 m/s
RV TAPSE: 30.1 mm
TDI e' lateral: 14.5
WEIGHTICAEL: 4323.2 [oz_av]

## 2016-10-20 LAB — MAGNESIUM: MAGNESIUM: 2.4 mg/dL (ref 1.7–2.4)

## 2016-10-20 MED ORDER — POTASSIUM CHLORIDE CRYS ER 20 MEQ PO TBCR
30.0000 meq | EXTENDED_RELEASE_TABLET | ORAL | Status: AC
  Administered 2016-10-20 (×2): 30 meq via ORAL
  Filled 2016-10-20 (×2): qty 1

## 2016-10-20 NOTE — Progress Notes (Signed)
[facility] and will initiate nursing care and assessment.

## 2016-10-20 NOTE — Progress Notes (Signed)
  Echocardiogram 2D Echocardiogram has been performed.  Taiwo Fish G Taejah Ohalloran 10/20/2016, 3:57 PM

## 2016-10-20 NOTE — Progress Notes (Addendum)
PROGRESS NOTE   Jennifer Maldonado  VFI:[id number]    DOB: 09-Mar-1976    DOA: 10/17/2016  PCP: Trey Sailors, PA   I have briefly reviewed patients previous medical records in [facility].  Brief Narrative:  41 year old female with PMH of HTN, presented with fever, chills, increased urinary frequency, foul-smelling urine, difficulty urinating and suprapubic pressure. She also noted trace hematuria on day prior to admission and started menstruating the day after admission. She reported left-sided headache and pain underneath the left ear without drainage, tinnitus or hearing loss. No sore throat. Admitted for possible UTI. Improving.   Assessment & Plan:   Principal Problem:   UTI (urinary tract infection) Active Problems:   Hypertension   Sepsis ([place])   AKI (acute kidney injury) ([place])   1. Sepsis due to acute lower UTI: Met sepsis criteria on admission including fever of 103, mild tachycardia, positive UA and leukocytosis. Treated per sepsis protocol with IV fluids and started empirically on IV ceftriaxone. Blood cultures 2: Negative to date. Urine culture : E Coli, sensitivities pending. No chest x-ray done on admission. Reported dyspnea on 8/8 and chest x-ray revealed bilateral pulmonary infiltrates including the lower lobes and probably the right middle lobe. No other respiratory symptoms i.e. cough, runny nose.? Pneumonia versus others.Spiked fever of 101.8 overnight. Continue current IV Rocephin but if she keeps spiking fever then may need to change antibiotics and ID consultation for further evaluation. 2. Escherichia coli Acute lower UTI: IV Rocephin pending sensitivities on urine culture. 3. ? Pneumonia: No respiratory symptoms on admission. Reported dyspnea on 8/8. Chest x-ray shows bilateral pulmonary infiltrates including the lower lobes and probably the right middle lobe. Continue IV Rocephin. Incentive spirometry. Trend pro calcitonin.? Cardiomegaly on chest x-ray. Check  2-D echo-report pending. Follow chest x-ray 8/10. 4. Hypokalemia: Replace and follow. Magnesium 2.4. 5. Acute kidney injury: Presented with creatinine of 1.45. Likely due to UTI, dehydration and sepsis. Resolved. 6. Essential hypertension: On amlodipine 10 MG daily at home. Currently controlled without medications, monitor off of medications for now. 7. Left earache: Resolved. Unclear etiology. 8. Microscopic hematuria: Reported some hematuria day prior to admission but started menstruating shortly thereafter.? Related to menstrual bleeding versus other. Outpatient evaluation as deemed necessary. 9. Microcytic anemia: Stable. Follow CBCs. 10. Depression: Patient states that she does not take Viibryd> DC'ed. Continue prior home dose of Wellbutrin. Stable. 11. Morbid Obesity/Body mass index is 46.38 kg/m. Needs to diet, exercise and lose weight.    DVT prophylaxis: Lovenox Code Status: Full Family Communication: None at bedside Disposition: DC home when medically improved.   Consultants:  None   Procedures:  None  Antimicrobials:  IV ceftriaxone    Subjective: Overall feels much better. Dyspnea resolved. No cough or chest pain. No earache. Mild intermittent left-sided frontal headache. No abdominal pain or dysuria.  ROS: No sore throat, nose drainage. Menstruating currently.  Objective:  Vitals:   10/19/16 2045 10/19/16 2351 10/20/16 0605 10/20/16 0918  BP: 133/72  138/80 130/78  Pulse: (!) 102  91 96  Resp: '19  18 18  ' Temp: (!) 101.8 F (38.8 C) 98.3 F (36.8 C) 99 F (37.2 C)   TempSrc: Oral Oral Oral   SpO2: 93%  96% 92%  Weight: 122.6 kg (270 lb 3.2 oz)     Height:        Examination:  General exam: Pleasant young female, moderately built and obese, lying comfortably propped up in bed.Does not look septic or toxic.  Neck: No cervical or submandibular lymphadenopathy appreciated. Respiratory system: Improved breath sounds. Slightly diminished in the bases but  better than yesterday. Occasional basal crackles which have improved. Rest of lung fields are clear to auscultation. Cardiovascular system: S1 & S2 heard, RRR. No JVD, murmurs, rubs, gallops or clicks. No pedal edema. Stable Gastrointestinal system: Abdomen is nondistended, soft and nontender. No organomegaly or masses felt. Normal bowel sounds heard. Stable Central nervous system: Alert and oriented. No focal neurological deficits. Stable Extremities: Symmetric 5 x 5 power. Skin: No rashes, lesions or ulcers Psychiatry: Judgement and insight appear normal. Mood & affect appropriate.     Data Reviewed: I have personally reviewed following labs and imaging studies  CBC:  Recent Labs Lab 10/17/16 1618 10/18/16 0519 10/19/16 0720 10/20/16 0440  WBC 20.4* 15.2* 17.7* 14.7*  NEUTROABS 17.2*  --   --   --   HGB 11.2* 8.4* 9.2* 8.3*  HCT 34.3* 26.4* 28.9* 25.9*  MCV 73.9* 71.7* 72.3* 70.2*  PLT 285 241 312 824   Basic Metabolic Panel:  Recent Labs Lab 10/17/16 1618 10/18/16 0519 10/19/16 0720 10/20/16 0440  NA 132* 136 136 136  K 3.6 3.0* 3.4* 3.3*  CL 99* 106 105 104  CO2 21* '22 24 23  ' GLUCOSE 104* 110* 94 100*  BUN '16 11 6 ' 5*  CREATININE 1.45* 1.10* 0.85 0.66  CALCIUM 8.8* 7.6* 8.1* 7.7*  MG  --   --   --  2.4   Liver Function Tests:  Recent Labs Lab 10/17/16 1618  AST 19  ALT 14  ALKPHOS 104  BILITOT 0.8  PROT 7.5  ALBUMIN 3.2*     Recent Results (from the past 240 hour(s))  Urine culture     Status: Abnormal (Preliminary result)   Collection Time: 10/17/16  5:34 PM  Result Value Ref Range Status   Specimen Description URINE, RANDOM  Final   Special Requests NONE  Final   Culture >=100,000 COLONIES/mL ESCHERICHIA COLI (A)  Final   Report Status PENDING  Incomplete  Blood culture (routine x 2)     Status: None (Preliminary result)   Collection Time: 10/17/16  5:38 PM  Result Value Ref Range Status   Specimen Description BLOOD BLOOD LEFT FOREARM  Final     Special Requests IN PEDIATRIC BOTTLE Blood Culture adequate volume  Final   Culture NO GROWTH 2 DAYS  Final   Report Status PENDING  Incomplete  Blood culture (routine x 2)     Status: None (Preliminary result)   Collection Time: 10/17/16  6:20 PM  Result Value Ref Range Status   Specimen Description BLOOD LEFT ANTECUBITAL  Final   Special Requests   Final    BOTTLES DRAWN AEROBIC AND ANAEROBIC Blood Culture adequate volume   Culture NO GROWTH 2 DAYS  Final   Report Status PENDING  Incomplete  Culture, blood (single)     Status: None (Preliminary result)   Collection Time: 10/18/16  8:34 AM  Result Value Ref Range Status   Specimen Description BLOOD RIGHT HAND  Final   Special Requests IN PEDIATRIC BOTTLE Blood Culture adequate volume  Final   Culture NO GROWTH 1 DAY  Final   Report Status PENDING  Incomplete         Radiology Studies: Dg Chest 2 View  Result Date: 10/19/2016 CLINICAL DATA:  Dyspnea. EXAM: CHEST  2 VIEW COMPARISON:  None FINDINGS: There are hazy bilateral lower lobe pulmonary infiltrates. There is also a hazy infiltrate  seen anteriorly on the lateral view which may be in the medial segment of the right middle lobe. No discrete effusions. Heart size and vascularity are within normal limits. IMPRESSION: Bilateral pulmonary infiltrates including the lower lobes and probably the right middle lobe. Electronically Signed   By: Lorriane Shire M.D.   On: 10/19/2016 10:52        Scheduled Meds: . buPROPion  150 mg Oral BID  . enoxaparin (LOVENOX) injection  40 mg Subcutaneous Q24H   Continuous Infusions: . cefTRIAXone (ROCEPHIN) IVPB 1 gram/50 mL D5W 1 g (10/20/16 1129)     LOS: 2 days     Hovanes Hymas, MD, FACP, FHM. [facility] Pager [phone]  If 7PM-7AM, please contact night-coverage [email or web address] Password [email or web address] 10/20/2016, 3:30 PM

## 2016-10-21 ENCOUNTER — Inpatient Hospital Stay (HOSPITAL_COMMUNITY): Payer: Medicaid Other

## 2016-10-21 DIAGNOSIS — R918 Other nonspecific abnormal finding of lung field: Secondary | ICD-10-CM

## 2016-10-21 DIAGNOSIS — J810 Acute pulmonary edema: Secondary | ICD-10-CM

## 2016-10-21 LAB — URINE CULTURE: Culture: >=100000 — AB

## 2016-10-21 LAB — CBC
HEMATOCRIT: 26.4 % — AB (ref 36.0–46.0)
HEMOGLOBIN: 8.5 g/dL — AB (ref 12.0–15.0)
MCH: 22.7 pg — ABNORMAL LOW (ref 26.0–34.0)
MCHC: 32.2 g/dL (ref 30.0–36.0)
MCV: 70.6 fL — ABNORMAL LOW (ref 78.0–100.0)
Platelets: 388 10*3/uL (ref 150–400)
RBC: 3.74 MIL/uL — ABNORMAL LOW (ref 3.87–5.11)
RDW: 17.2 % — AB (ref 11.5–15.5)
WBC: 11 10*3/uL — AB (ref 4.0–10.5)

## 2016-10-21 LAB — BASIC METABOLIC PANEL
Anion gap: 8 (ref 5–15)
BUN: 7 mg/dL (ref 6–20)
CO2: 24 mmol/L (ref 22–32)
CREATININE: 0.75 mg/dL (ref 0.44–1.00)
Calcium: 7.9 mg/dL — ABNORMAL LOW (ref 8.9–10.3)
Chloride: 106 mmol/L (ref 101–111)
Glucose, Bld: 90 mg/dL (ref 65–99)
Potassium: 3.3 mmol/L — ABNORMAL LOW (ref 3.5–5.1)
SODIUM: 138 mmol/L (ref 135–145)

## 2016-10-21 MED ORDER — FUROSEMIDE 20 MG PO TABS
20.0000 mg | ORAL_TABLET | Freq: Once | ORAL | Status: AC
  Administered 2016-10-21: 20 mg via ORAL
  Filled 2016-10-21: qty 1

## 2016-10-21 MED ORDER — POTASSIUM CHLORIDE ER 20 MEQ PO TBCR: 20.0000 meq | EXTENDED_RELEASE_TABLET | Freq: Every day | ORAL | 0 refills | Status: DC

## 2016-10-21 MED ORDER — POTASSIUM CHLORIDE CRYS ER 20 MEQ PO TBCR
40.0000 meq | EXTENDED_RELEASE_TABLET | ORAL | Status: AC
  Administered 2016-10-21 (×2): 40 meq via ORAL
  Filled 2016-10-21 (×2): qty 2

## 2016-10-21 MED ORDER — CEPHALEXIN 500 MG PO CAPS: 500.0000 mg | ORAL_CAPSULE | Freq: Four times a day (QID) | ORAL | 0 refills | Status: AC

## 2016-10-21 MED ORDER — FUROSEMIDE 20 MG PO TABS: 20.0000 mg | ORAL_TABLET | Freq: Every day | ORAL | 0 refills | Status: DC

## 2016-10-21 MED ORDER — CEPHALEXIN 500 MG PO CAPS: 500.0000 mg | ORAL_CAPSULE | Freq: Four times a day (QID) | ORAL | 0 refills | Status: DC

## 2016-10-21 NOTE — Progress Notes (Signed)
Bedside reporting done with Jay,RN. Pt was provided with a brand new Incentive spirometer.

## 2016-10-21 NOTE — Discharge Summary (Signed)
Physician Discharge Summary  Jennifer Maldonado OZH:[id number] DOB: Jul 21, 1975  PCP: Trey Sailors, PA  Admit date: 10/17/2016 Discharge date: 10/21/2016  Recommendations for Outpatient Follow-up:  1. Raelyn Number, PA/PCP in 3 days with repeat labs (CBC, BMP & Chest x-ray). Please follow final blood culture results that were sent from the hospital.   Home Health: None Equipment/Devices: None    Discharge Condition: Improved and stable  CODE STATUS: Full  Diet recommendation: Heart healthy diet.  Discharge Diagnoses:  Principal Problem:   UTI (urinary tract infection) Active Problems:   Hypertension   Sepsis ([place])   AKI (acute kidney injury) ([place])   Brief Summary: 41 year old female with PMH of HTN, depression, morbid obesity, presented with fever, chills, increased urinary frequency, foul-smelling urine, difficulty urinating and suprapubic pressure. She also noted trace hematuria on day prior to admission and started menstruating the day after admission. She reported left-sided headache and pain underneath the left ear without drainage, tinnitus or hearing loss. No sore throat. Admitted for sepsis due to Escherichia coli UTI.   Assessment & Plan:   1. Sepsis due to Escherichia coli acute lower UTI: Met sepsis criteria on admission including fever of 103, mild tachycardia, positive UA and leukocytosis. Treated per sepsis protocol with IV fluids and started empirically on IV ceftriaxone. Blood cultures 2: Negative to date. Urine culture : E Coli, pansensitive except to amoxicillin and ampicillin sulbactam. Spiked fever of 101.8 overnight. No fever for >36 hours. Sepsis resolved. 2. Escherichia coli Acute lower UTI:  completed 5 days of IV ceftriaxone in the hospital. Patient will be discharged on oral Keflex to complete total 7 days treatment. 3. Acute pulmonary edema: Patient did not have any respiratory symptoms on admission and hence no chest x-ray was done on admission. 2  days into the admission, she complained of dyspnea without cough, chest pain. This was mostly on exertion. Chest x-ray was reported as bilateral infiltrates which was not clinically consistent with pneumonia. She probably had some degree of pulmonary edema from sepsis and aggressive IV fluid hydration during initial part of hospitalization. Dyspnea has since resolved. Follow-up chest x-ray shows worsening of infiltrates but patient does not have much symptoms. Started Lasix 20 mg daily 3 days along with potassium supplements. Close follow-up outpatient with PCP along with repeat chest x-ray. 2-D echo shows normal EF. 4. Hypokalemia:  replaced aggressively prior to discharge. Magnesium 2.4. Continue low-dose potassium supplements while she is on brief low-dose Lasix. Follow BMP as outpatient. Patient counseled regarding consumption of high potassium diet and she verbalized understanding. 5. Acute kidney injury: Presented with creatinine of 1.45. Likely due to UTI, dehydration and sepsis. Resolved. 6. Essential hypertension: On amlodipine 10 MG daily at home. This was held while she was hospitalized since blood pressures were mostly controlled. Now occasional increase in BPs. Resume amlodipine at discharge. 7. Left earache: Resolved. Unclear etiology. This was pain in the outer part of the year underneath the left earlobe. 8. Microscopic hematuria: Reported some hematuria day prior to admission but started menstruating shortly thereafter.? Related to menstrual bleeding versus other. Outpatient evaluation as deemed necessary. 9. Microcytic anemia: Stable. Follow CBCs as outpatient. 10. Depression: Patient states that she does not take Viibryd> DC'ed. Continue prior home dose of Wellbutrin. Stable. 11. Morbid Obesity/Body mass index is 46.38 kg/m. Needs to diet, exercise and lose weight.    Consultants:  None   Procedures:  2-D echo 10/20/16: Study Conclusions  - Left ventricle: The cavity size  was normal.  There was mild   concentric hypertrophy. Systolic function was normal. The   estimated ejection fraction was in the range of 60% to 65%. Wall   motion was normal; there were no regional wall motion   abnormalities. Left ventricular diastolic function parameters   were normal. - Aortic valve: Trileaflet; normal thickness leaflets. There was no   regurgitation. - Aortic root: The aortic root was normal in size. - Mitral valve: There was trivial regurgitation. - Left atrium: The atrium was at the upper limits of normal in   size. - Right ventricle: Systolic function was normal. - Tricuspid valve: There was trivial regurgitation. - Pulmonary arteries: Systolic pressure was within the normal   range. - Inferior vena cava: The vessel was normal in size. - Pericardium, extracardiac: There was no pericardial effusion.   Discharge Instructions  Discharge Instructions    (HEART FAILURE PATIENTS) Call MD:  Anytime you have any of the following symptoms: 1) 3 pound weight gain in 24 hours or 5 pounds in 1 week 2) shortness of breath, with or without a dry hacking cough 3) swelling in the hands, feet or stomach 4) if you have to sleep on extra pillows at night in order to breathe.    Complete by:  As directed    Call MD for:  difficulty breathing, headache or visual disturbances    Complete by:  As directed    Call MD for:  extreme fatigue    Complete by:  As directed    Call MD for:  persistant dizziness or light-headedness    Complete by:  As directed    Call MD for:  severe uncontrolled pain    Complete by:  As directed    Call MD for:  temperature >100.4    Complete by:  As directed    Diet - low sodium heart healthy    Complete by:  As directed    Increase activity slowly    Complete by:  As directed        Medication List    TAKE these medications   ALEVE 220 MG tablet Generic drug:  naproxen sodium Take 440 mg by mouth 2 (two) times daily as needed (for pain).    amLODipine 10 MG tablet Commonly known as:  NORVASC Take 10 mg by mouth daily.   buPROPion 200 MG 12 hr tablet Commonly known as:  WELLBUTRIN SR Take 200 mg by mouth 2 (two) times daily.   cephALEXin 500 MG capsule Commonly known as:  KEFLEX Take 1 capsule (500 mg total) by mouth 4 (four) times daily.   cetirizine 10 MG tablet Commonly known as:  ZYRTEC Take 10 mg by mouth daily as needed for allergies.   furosemide 20 MG tablet Commonly known as:  LASIX Take 1 tablet (20 mg total) by mouth daily.   Potassium Chloride ER 20 MEQ Tbcr Take 20 mEq by mouth daily.      Follow-up Information    Trey Sailors, [place]. Schedule an appointment as soon as possible for a visit in 3 day(s).   Specialty:  Physician Assistant Why:  To be seen with repeat labs (CBC, BMP & chest x-ray). Contact information: [place] [id number] [phone]          No Known Allergies    Procedures/Studies: Dg Chest 2 View  Result Date: 10/21/2016 CLINICAL DATA:  Pt has bilateral lung infiltrates. Admitted on Monday. EXAM: CHEST - 2 VIEW COMPARISON:  10/19/2016 FINDINGS:  Slight progression of Coarse lower lung interstitial airspace opacities with some relative sparing peripherally, worse on the left than right. Heart size upper limits normal. Some thickening of or fluid in the interlobar fissures. No definite effusion. Visualized bones unremarkable. IMPRESSION: 1. Worsening of asymmetric bibasilar infiltrates or edema, left worse than right. Electronically Signed   By: Lucrezia Europe M.D.   On: 10/21/2016 09:13   Dg Chest 2 View  Result Date: 10/19/2016 CLINICAL DATA:  Dyspnea. EXAM: CHEST  2 VIEW COMPARISON:  None FINDINGS: There are hazy bilateral lower lobe pulmonary infiltrates. There is also a hazy infiltrate seen anteriorly on the lateral view which may be in the medial segment of the right middle lobe. No discrete effusions. Heart size and vascularity are within normal  limits. IMPRESSION: Bilateral pulmonary infiltrates including the lower lobes and probably the right middle lobe. Electronically Signed   By: Lorriane Shire M.D.   On: 10/19/2016 10:52      Subjective: Seen this morning. No dyspnea, chest pain, cough, left earache. No urinary symptoms reported. Gives history of long-standing intermittent mild frontal headache that she has during menstrual periods and current headaches are similar to that. Also reports occasional transient dizziness without feeling like passing out or syncopal episodes that started approximately a week ago along with current acute illness. Reassured her and advised her that her dizziness is likely related to the acute illness (orthostatics negative), advised rest, gradual increase in activity and follow up with her PCP.  Discharge Exam:  Vitals:   10/20/16 1700 10/20/16 2032 10/21/16 0535 10/21/16 0900  BP: (!) 145/87 139/82 125/65 115/74  Pulse: 98 97 87 84  Resp: '18 19 20 18  ' Temp: 98.4 F (36.9 C) 98.5 F (36.9 C) 97.8 F (36.6 C) 98 F (36.7 C)  TempSrc: Oral Oral Oral Oral  SpO2: 94% 95% 98% 92%  Weight:  122.5 kg (270 lb 1 oz)    Height:        General exam: Pleasant young female, moderately built and obese, lying comfortably propped up in bed. Does not look septic or toxic. Neck: No cervical or submandibular lymphadenopathy appreciated. Respiratory system: Occasional basal crackles but otherwise clear to auscultation. No wheezing or rhonchi appreciated. No increased work of breathing. Cardiovascular system: S1 & S2 heard, RRR. No JVD, murmurs, rubs, gallops or clicks. No pedal edema. Gastrointestinal system: Abdomen is nondistended, soft and nontender. No organomegaly or masses felt. Normal bowel sounds heard.  Central nervous system: Alert and oriented. No focal neurological deficits.  Extremities: Symmetric 5 x 5 power. Skin: No rashes, lesions or ulcers Psychiatry: Judgement and insight appear normal. Mood  & affect appropriate.    The results of significant diagnostics from this hospitalization (including imaging, microbiology, ancillary and laboratory) are listed below for reference.     Microbiology: Recent Results (from the past 240 hour(s))  Urine culture     Status: Abnormal   Collection Time: 10/17/16  5:34 PM  Result Value Ref Range Status   Specimen Description URINE, RANDOM  Final   Special Requests NONE  Final   Culture >=100,000 COLONIES/mL ESCHERICHIA COLI (A)  Final   Report Status 10/21/2016 FINAL  Final   Organism ID, Bacteria ESCHERICHIA COLI (A)  Final      Susceptibility   Escherichia coli - MIC*    AMPICILLIN >=32 RESISTANT Resistant     CEFAZOLIN <=4 SENSITIVE Sensitive     CEFTRIAXONE <=1 SENSITIVE Sensitive     CIPROFLOXACIN <=0.25 SENSITIVE Sensitive  GENTAMICIN <=1 SENSITIVE Sensitive     IMIPENEM <=0.25 SENSITIVE Sensitive     NITROFURANTOIN <=16 SENSITIVE Sensitive     TRIMETH/SULFA <=20 SENSITIVE Sensitive     AMPICILLIN/SULBACTAM >=32 RESISTANT Resistant     PIP/TAZO <=4 SENSITIVE Sensitive     Extended ESBL NEGATIVE Sensitive     * >=100,000 COLONIES/mL ESCHERICHIA COLI  Blood culture (routine x 2)     Status: None (Preliminary result)   Collection Time: 10/17/16  5:38 PM  Result Value Ref Range Status   Specimen Description BLOOD BLOOD LEFT FOREARM  Final   Special Requests IN PEDIATRIC BOTTLE Blood Culture adequate volume  Final   Culture NO GROWTH 4 DAYS  Final   Report Status PENDING  Incomplete  Blood culture (routine x 2)     Status: None (Preliminary result)   Collection Time: 10/17/16  6:20 PM  Result Value Ref Range Status   Specimen Description BLOOD LEFT ANTECUBITAL  Final   Special Requests   Final    BOTTLES DRAWN AEROBIC AND ANAEROBIC Blood Culture adequate volume   Culture NO GROWTH 4 DAYS  Final   Report Status PENDING  Incomplete  Culture, blood (single)     Status: None (Preliminary result)   Collection Time: 10/18/16  8:34  AM  Result Value Ref Range Status   Specimen Description BLOOD RIGHT HAND  Final   Special Requests IN PEDIATRIC BOTTLE Blood Culture adequate volume  Final   Culture NO GROWTH 3 DAYS  Final   Report Status PENDING  Incomplete     Labs: CBC:  Recent Labs Lab 10/17/16 1618 10/18/16 0519 10/19/16 0720 10/20/16 0440 10/21/16 0404  WBC 20.4* 15.2* 17.7* 14.7* 11.0*  NEUTROABS 17.2*  --   --   --   --   HGB 11.2* 8.4* 9.2* 8.3* 8.5*  HCT 34.3* 26.4* 28.9* 25.9* 26.4*  MCV 73.9* 71.7* 72.3* 70.2* 70.6*  PLT 285 241 312 312 646   Basic Metabolic Panel:  Recent Labs Lab 10/17/16 1618 10/18/16 0519 10/19/16 0720 10/20/16 0440 10/21/16 0404  NA 132* 136 136 136 138  K 3.6 3.0* 3.4* 3.3* 3.3*  CL 99* 106 105 104 106  CO2 21* '22 24 23 24  ' GLUCOSE 104* 110* 94 100* 90  BUN '16 11 6 ' 5* 7  CREATININE 1.45* 1.10* 0.85 0.66 0.75  CALCIUM 8.8* 7.6* 8.1* 7.7* 7.9*  MG  --   --   --  2.4  --    Liver Function Tests:  Recent Labs Lab 10/17/16 1618  AST 19  ALT 14  ALKPHOS 104  BILITOT 0.8  PROT 7.5  ALBUMIN 3.2*   Urinalysis    Component Value Date/Time   COLORURINE AMBER (A) 10/17/2016 1656   APPEARANCEUR TURBID (A) 10/17/2016 1656   LABSPEC 1.023 10/17/2016 1656   PHURINE 5.0 10/17/2016 1656   GLUCOSEU NEGATIVE 10/17/2016 1656   HGBUR LARGE (A) 10/17/2016 1656   BILIRUBINUR NEGATIVE 10/17/2016 1656   KETONESUR NEGATIVE 10/17/2016 1656   PROTEINUR 100 (A) 10/17/2016 1656   UROBILINOGEN 0.2 12/23/2006 1925   NITRITE NEGATIVE 10/17/2016 1656   LEUKOCYTESUR MODERATE (A) 10/17/2016 1656      Time coordinating discharge: Over 30 minutes  SIGNED:  Vernell Leep, MD, FACP, FHM. [facility] Pager [phone]  If 7PM-7AM, please contact night-coverage [email or web address] Password [email or web address] 10/21/2016, 2:26 PM

## 2016-10-21 NOTE — Care Management Note (Signed)
Case Management Note  Patient Details  Name: Jennifer Maldonado MRN: [id number] Date of Birth: Aug 14, 1975  Subjective/Objective:  CM following for progression and d/c planning.                   Action/Plan: 10/21/2016 Noted plan to d/c to home, no HH or DME needs identified and pt will schedule her own appointment with her PCP for followup.  Expected Discharge Date:  10/21/16               Expected Discharge Plan:  Home/Self Care  In-House Referral:  NA  Discharge planning Services  NA  Post Acute Care Choice:  NA Choice offered to:  NA  DME Arranged:  N/A DME Agency:  NA  HH Arranged:  NA HH Agency:  NA  Status of Service:  Completed, signed off  If discussed at Long Length of Stay Meetings, dates discussed:    Additional Comments:  Starlyn SkeansRoyal, Trevonne Nyland U, RN 10/21/2016, 2:44 PM

## 2016-10-21 NOTE — Progress Notes (Signed)
Patient Discharge: Disposition: Patient discharged to home. Education: Reviewed medications, prescriptions, follow-up appointments and discharge instructions, verbalized understanding. IV: Discontinued IV before discharge, site clean and dry. Telemetry: Discontinued Tele before discharge, CCMD notified. Transportation: Patient escorted of the unit in w/c till the ride. Belongings: Patient took all her belongings with her.  Note:  Reinforced the patient about the picking the medications from only one pharmacy.

## 2016-10-22 LAB — CULTURE, BLOOD (ROUTINE X 2)
Culture: NO GROWTH
Culture: NO GROWTH
SPECIAL REQUESTS: ADEQUATE
SPECIAL REQUESTS: ADEQUATE

## 2016-10-23 LAB — CULTURE, BLOOD (SINGLE)
Culture: NO GROWTH
Special Requests: ADEQUATE

## 2019-04-01 ENCOUNTER — Ambulatory Visit (HOSPITAL_COMMUNITY)
Admission: EM | Admit: 2019-04-01 | Discharge: 2019-04-01 | Disposition: A | Discharge status: Home / Self Care | Payer: Medicaid Other | Attending: Family Medicine | Admitting: Family Medicine

## 2019-04-01 ENCOUNTER — Encounter (HOSPITAL_COMMUNITY): Payer: Self-pay

## 2019-04-01 ENCOUNTER — Other Ambulatory Visit: Payer: Self-pay

## 2019-04-01 DIAGNOSIS — Z79899 Other long term (current) drug therapy: Secondary | ICD-10-CM | POA: Diagnosis not present

## 2019-04-01 DIAGNOSIS — Z8249 Family history of ischemic heart disease and other diseases of the circulatory system: Secondary | ICD-10-CM | POA: Insufficient documentation

## 2019-04-01 DIAGNOSIS — I10 Essential (primary) hypertension: Secondary | ICD-10-CM | POA: Diagnosis not present

## 2019-04-01 DIAGNOSIS — R030 Elevated blood-pressure reading, without diagnosis of hypertension: Secondary | ICD-10-CM | POA: Diagnosis present

## 2019-04-01 DIAGNOSIS — J029 Acute pharyngitis, unspecified: Secondary | ICD-10-CM | POA: Diagnosis not present

## 2019-04-01 DIAGNOSIS — R519 Headache, unspecified: Secondary | ICD-10-CM | POA: Diagnosis present

## 2019-04-01 DIAGNOSIS — Z20822 Contact with and (suspected) exposure to covid-19: Secondary | ICD-10-CM | POA: Insufficient documentation

## 2019-04-01 NOTE — Discharge Instructions (Signed)
Continue to take your blood pressure medication.  Self isolate until covid results are back and negative.  Will notify you by phone of any positive findings. Your negative results will be sent through your MyChart.     Tylenol and/or ibuprofen as needed for pain or fevers.  Rest. Push fluids to ensure adequate hydration and keep secretions thin.  See provided information.  Follow up with your PCP in the next month for recheck of your BP.

## 2019-04-01 NOTE — ED Triage Notes (Signed)
Pt present cough and sore throat, symptoms started on Saturday. Pt denies any other symptoms

## 2019-04-01 NOTE — ED Provider Notes (Signed)
[facility]    CSN: [id number] Arrival date & time: 04/01/19  1828      History   Chief Complaint Chief Complaint  Patient presents with  . Sore Throat  . Cough    HPI Charae R Maldonado is a 44 y.o. female.   [name] presents with complaints of intermittent dizziness. The past two days has had some sore throat. Slight cough. Some fatigued. No current dizziness. She feels symptoms off and on. Took her blood pressure medication today, as she hasn't taken it regularly. Hasn't seen her PCP recently. No nasal drainage. Headache, no body aches. Only mild headache now. No chest pain. No shortness of breath. Daughter with illness, including loss of taste and smell, starting last week. No other known ill contacts.   ROS per HPI, negative if not otherwise mentioned.      Past Medical History:  Diagnosis Date  . Hypertension     Patient Active Problem List   Diagnosis Date Noted  . UTI (urinary tract infection) 10/17/2016  . Sepsis ([facility]) 10/17/2016  . AKI (acute kidney injury) ([facility]) 10/17/2016  . Hypertension     Past Surgical History:  Procedure Laterality Date  . CESAREAN SECTION  [phone]    OB History   No obstetric history on file.      Home Medications    Prior to Admission medications   Medication Sig Start Date End Date Taking? Authorizing Provider  amLODipine (NORVASC) 10 MG tablet Take 10 mg by mouth daily. 09/15/16   [provider]  buPROPion (WELLBUTRIN SR) 200 MG 12 hr tablet Take 200 mg by mouth 2 (two) times daily. 09/15/16   [provider]  cetirizine (ZYRTEC) 10 MG tablet Take 10 mg by mouth daily as needed for allergies.  09/15/16   [provider]  furosemide (LASIX) 20 MG tablet Take 1 tablet (20 mg total) by mouth daily. 10/22/16   Hongalgi, Maximino Greenland, MD  naproxen sodium (ALEVE) 220 MG tablet Take 440 mg by mouth 2 (two) times daily as needed (for pain).     [provider]  Potassium  Chloride ER 20 MEQ TBCR Take 20 mEq by mouth daily. 10/22/16   Hongalgi, Maximino Greenland, MD    Family History Family History  Problem Relation Age of Onset  . Hypertension Mother     Social History Social History   Tobacco Use  . Smoking status: Never Smoker  . Smokeless tobacco: Never Used  Substance Use Topics  . Alcohol use: No  . Drug use: No     Allergies   Patient has no known allergies.   Review of Systems Review of Systems   Physical Exam Triage Vital Signs ED Triage Vitals [04/01/19 1923]  Enc Vitals Group     BP (!) 152/92     Pulse Rate 73     Resp 18     Temp 98.3 F (36.8 C)     Temp Source Oral     SpO2 100 %     Weight      Height      Head Circumference      Peak Flow      Pain Score 0     Pain Loc      Pain Edu?      Excl. in GC?    No data found.  Updated Vital Signs BP (!) 152/92 (BP Location: Right Arm)   Pulse 73   Temp 98.3 F (36.8 C) (  Oral)   Resp 18   LMP 03/15/2019   SpO2 100%   Visual Acuity Right Eye Distance:   Left Eye Distance:   Bilateral Distance:    Right Eye Near:   Left Eye Near:    Bilateral Near:     Physical Exam Constitutional:      General: She is not in acute distress.    Appearance: She is well-developed.  Cardiovascular:     Rate and Rhythm: Normal rate.  Pulmonary:     Effort: Pulmonary effort is normal.  Skin:    General: Skin is warm and dry.  Neurological:     Mental Status: She is alert and oriented to person, place, and time.      UC Treatments / Results  Labs (all labs ordered are listed, but only abnormal results are displayed) Labs Reviewed  NOVEL CORONAVIRUS, NAA (HOSP ORDER, SEND-OUT TO REF LAB; TAT 18-24 HRS)    EKG   Radiology No results found.  Procedures Procedures (including critical care time)  Medications Ordered in UC Medications - No data to display  Initial Impression / Assessment and Plan / UC Course  I have reviewed the triage vital signs and the nursing  notes.  Pertinent labs & imaging results that were available during my care of the patient were reviewed by me and considered in my medical decision making (see chart for details).     Non toxic. Benign physical exam.  No significant symptoms currently. Mild hypertension noted. Encouraged to continue with BP medication daily as prescribed. covid testing pending. Continue with supportive cares for symptoms. Encouraged follow up with PCP for bp for recheck and management. Return precautions provided. Patient verbalized understanding and agreeable to plan.   Final Clinical Impressions(s) / UC Diagnoses   Final diagnoses:  Sore throat  Acute nonintractable headache, unspecified headache type  Elevated blood pressure reading     Discharge Instructions     Continue to take your blood pressure medication.  Self isolate until covid results are back and negative.  Will notify you by phone of any positive findings. Your negative results will be sent through your MyChart.     Tylenol and/or ibuprofen as needed for pain or fevers.  Rest. Push fluids to ensure adequate hydration and keep secretions thin.  See provided information.  Follow up with your PCP in the next month for recheck of your BP.      ED Prescriptions    None     PDMP not reviewed this encounter.   Zigmund Gottron, NP 04/01/19 2036

## 2019-04-03 LAB — NOVEL CORONAVIRUS, NAA (HOSP ORDER, SEND-OUT TO REF LAB; TAT 18-24 HRS): SARS-CoV-2, NAA: NOT DETECTED

## 2021-08-19 ENCOUNTER — Other Ambulatory Visit: Payer: Self-pay | Admitting: Physician Assistant

## 2021-08-19 DIAGNOSIS — Z1231 Encounter for screening mammogram for malignant neoplasm of breast: Secondary | ICD-10-CM

## 2022-05-26 ENCOUNTER — Encounter (HOSPITAL_COMMUNITY): Payer: Self-pay | Admitting: *Deleted

## 2022-05-26 ENCOUNTER — Ambulatory Visit (HOSPITAL_COMMUNITY)
Admission: EM | Admit: 2022-05-26 | Discharge: 2022-05-26 | Disposition: A | Discharge status: Home / Self Care | Payer: Medicaid Other | Attending: Family Medicine | Admitting: Family Medicine

## 2022-05-26 ENCOUNTER — Other Ambulatory Visit: Payer: Self-pay

## 2022-05-26 DIAGNOSIS — R519 Headache, unspecified: Secondary | ICD-10-CM | POA: Diagnosis not present

## 2022-05-26 DIAGNOSIS — I10 Essential (primary) hypertension: Secondary | ICD-10-CM

## 2022-05-26 MED ORDER — AMLODIPINE BESYLATE 10 MG PO TABS: 10.0000 mg | ORAL_TABLET | Freq: Every day | ORAL | 1 refills | Status: AC

## 2022-05-26 MED ORDER — KETOROLAC TROMETHAMINE 60 MG/2ML IM SOLN
INTRAMUSCULAR | Status: AC
  Filled 2022-05-26: qty 2

## 2022-05-26 MED ORDER — KETOROLAC TROMETHAMINE 60 MG/2ML IM SOLN
60.0000 mg | Freq: Once | INTRAMUSCULAR | Status: AC
  Administered 2022-05-26: 60 mg via INTRAMUSCULAR

## 2022-05-26 NOTE — Discharge Instructions (Signed)
Your blood pressure was noted to be elevated during your visit today. If you are currently taking medication for high blood pressure, please ensure you are taking this as directed. If you do not have a history of high blood pressure and your blood pressure remains persistently elevated, you may need to begin taking a medication at some point. You may return here within the next few days to recheck if unable to see your primary care provider or if you do not have a one.  BP (!) 157/101   Pulse 81   Temp 97.9 F (36.6 C)   Resp 18   LMP 04/27/2022   SpO2 96%   BP Readings from Last 3 Encounters:  05/26/22 (!) 157/101  04/01/19 (!) 152/92  10/21/16 115/74

## 2022-05-26 NOTE — ED Triage Notes (Addendum)
Pt reports having a HA for 2 weeks. Pt hashad nausea with HA. PT is out of BP med .

## 2022-05-31 NOTE — ED Provider Notes (Signed)
Jennifer Maldonado   [id number] 05/26/22 Arrival Time: 1906  ASSESSMENT & PLAN:  1. Acute intractable headache, unspecified headache type   2. Elevated blood pressure reading in office with diagnosis of hypertension    Current BP similar to that 3 y ago. Needs HTN med; prescribed. Normal neurological exam. Not worst HA of life.   Meds ordered this encounter  Medications   ketorolac (TORADOL) injection 60 mg   amLODipine (NORVASC) 10 MG tablet    Sig: Take 1 tablet (10 mg total) by mouth daily.    Dispense:  30 tablet    Refill:  1   Encourage her to schedule f/u with her PCP.   Discharge Instructions      Your blood pressure was noted to be elevated during your visit today. If you are currently taking medication for high blood pressure, please ensure you are taking this as directed. If you do not have a history of high blood pressure and your blood pressure remains persistently elevated, you may need to begin taking a medication at some point. You may return here within the next few days to recheck if unable to see your primary care provider or if you do not have a one.  BP (!) 157/101   Pulse 81   Temp 97.9 F (36.6 C)   Resp 18   LMP 04/27/2022   SpO2 96%   BP Readings from Last 3 Encounters:  05/26/22 (!) 157/101  04/01/19 (!) 152/92  10/21/16 115/74     Follow-up Information     [facility] at [facility].   Specialty: Emergency Medicine Why: If symptoms worsen in any way. Contact information: [address] [id number] [place] [phone]                Reviewed expectations re: course of current medical issues. Questions answered. Outlined signs and symptoms indicating need for more acute intervention. Patient verbalized understanding. After Visit Summary given.   SUBJECTIVE:  Jennifer Maldonado is a 47 y.o. female who presents with concerns regarding increased blood  pressures. She is treated for HTN but reports inconsistency with taking meds. "My medicines are a few years old". She reports no medication side effects noted, no chest pain on exertion, no dyspnea on exertion, no swelling of ankles, no orthostatic dizziness or lightheadedness, no orthopnea or paroxysmal nocturnal dyspnea, and no palpitations.  Social History   Tobacco Use  Smoking Status Never  Smokeless Tobacco Never    OBJECTIVE:  Vitals:   05/26/22 1956  BP: (!) 157/101  Pulse: 81  Resp: 18  Temp: 97.9 F (36.6 C)  SpO2: 96%    General appearance: alert; no distress Eyes: PERRLA; EOMI HENT: normocephalic; atraumatic Neck: supple Lungs: clear to auscultation bilaterally Heart: regular rate and rhythm without murmer Abdomen: soft, non-tender; bowel sounds normal Extremities: no edema; symmetrical with no gross deformities Skin: warm and dry Psychological: alert and cooperative; normal mood and affect  ECG: Orders placed or performed during the hospital encounter of 10/17/16   EKG 12-Lead   EKG 12-Lead   EKG 12-Lead   EKG 12-Lead    Labs: Results for orders placed or performed during the hospital encounter of 04/01/19  Novel Coronavirus, NAA (Hosp order, Send-out to Ref Lab; TAT 18-24 hrs   Specimen: Nasopharyngeal Swab; Respiratory  Result Value Ref Range   SARS-CoV-2, NAA NOT DETECTED NOT DETECTED   Coronavirus Source NASOPHARYNGEAL    Labs Reviewed - No  data to display  Imaging: No results found.  No Known Allergies  Past Medical History:  Diagnosis Date   Hypertension    Social History   Socioeconomic History   Marital status: Single    Spouse name: Not on file   Number of children: 4   Years of education: Not on file   Highest education level: Not on file  Occupational History   Not on file  Tobacco Use   Smoking status: Never   Smokeless tobacco: Never  Vaping Use   Vaping Use: Never used  Substance and Sexual Activity   Alcohol use:  No   Drug use: No   Sexual activity: Not on file  Other Topics Concern   Not on file  Social History Narrative   Not on file   Social Determinants of Health   Financial Resource Strain: Not on file  Food Insecurity: Not on file  Transportation Needs: Not on file  Physical Activity: Not on file  Stress: Not on file  Social Connections: Not on file  Intimate Partner Violence: Not on file   Family History  Problem Relation Age of Onset   Hypertension Mother    Past Surgical History:  Procedure Laterality Date   CESAREAN SECTION  [id number]       Vanessa Kick, MD 05/31/22 1022

## 2022-06-17 ENCOUNTER — Other Ambulatory Visit: Payer: Self-pay | Admitting: Internal Medicine

## 2022-06-17 DIAGNOSIS — Z1231 Encounter for screening mammogram for malignant neoplasm of breast: Secondary | ICD-10-CM

## 2022-07-07 ENCOUNTER — Institutional Professional Consult (permissible substitution): Payer: Medicaid Other | Admitting: Nurse Practitioner

## 2022-07-27 ENCOUNTER — Institutional Professional Consult (permissible substitution): Payer: Medicaid Other | Admitting: Nurse Practitioner

## 2022-07-29 ENCOUNTER — Ambulatory Visit: Payer: Medicaid Other

## 2022-07-29 ENCOUNTER — Encounter: Payer: Self-pay | Admitting: Nurse Practitioner

## 2022-12-09 ENCOUNTER — Ambulatory Visit (INDEPENDENT_AMBULATORY_CARE_PROVIDER_SITE_OTHER): Payer: Medicaid Other | Admitting: Primary Care

## 2022-12-09 ENCOUNTER — Encounter: Payer: Self-pay | Admitting: Primary Care

## 2022-12-09 VITALS — BP 146/86 | HR 79 | Wt 245.0 lb

## 2022-12-09 DIAGNOSIS — R0683 Snoring: Secondary | ICD-10-CM | POA: Diagnosis not present

## 2022-12-09 NOTE — Progress Notes (Unsigned)
 @  Patient ID: Jennifer Maldonado, female    DOB: 10-31-1975, 47 y.o.   MRN: [id number]  No chief complaint on file.   Referring provider: Norm Salt, PA  HPI: 47 year old female, never smoked.  Past medical history significant for HTN.  12/09/2022 Patient presents today for sleep consult. She has symptoms of snoring and restless sleep She has never been told that she stops breathing in her sleep. She not waking herself up  She is tired every day but she does not actually fall asleep when in active     Sleep questionnaire Symptoms- snoring  Prior sleep study- none  Bedtime-  Time to fall asleep-  Nocturnal awakenings- twice to use restroom Out of bed/start of day- 5am Weight changes- down 20 lbs (highest weight 275lb) Do you operate heavy machinery-  Do you currently wear CPAP-  Do you current wear oxygen-  Epworth- 4   No Known Allergies   There is no immunization history on file for this patient.  Past Medical History:  Diagnosis Date   Hypertension     Tobacco History: Social History   Tobacco Use  Smoking Status Never  Smokeless Tobacco Never   Counseling given: Not Answered   Outpatient Medications Prior to Visit  Medication Sig Dispense Refill   amLODipine (NORVASC) 10 MG tablet Take 1 tablet (10 mg total) by mouth daily. 30 tablet 1   buPROPion (WELLBUTRIN SR) 200 MG 12 hr tablet Take 200 mg by mouth 2 (two) times daily.  3   cetirizine (ZYRTEC) 10 MG tablet Take 10 mg by mouth daily as needed for allergies.   1   furosemide (LASIX) 20 MG tablet Take 1 tablet (20 mg total) by mouth daily. 3 tablet 0   naproxen sodium (ALEVE) 220 MG tablet Take 440 mg by mouth 2 (two) times daily as needed (for pain).      Potassium Chloride ER 20 MEQ TBCR Take 20 mEq by mouth daily. 3 tablet 0   No facility-administered medications prior to visit.      Review of Systems  Review of Systems   Physical Exam  There were no vitals taken for this  visit. Physical Exam   Lab Results:  CBC    Component Value Date/Time   WBC 11.0 (H) 10/21/2016 0404   RBC 3.74 (L) 10/21/2016 0404   HGB 8.5 (L) 10/21/2016 0404   HCT 26.4 (L) 10/21/2016 0404   PLT 388 10/21/2016 0404   MCV 70.6 (L) 10/21/2016 0404   MCH 22.7 (L) 10/21/2016 0404   MCHC 32.2 10/21/2016 0404   RDW 17.2 (H) 10/21/2016 0404   LYMPHSABS 1.0 10/17/2016 1618   MONOABS 2.2 (H) 10/17/2016 1618   EOSABS 0.0 10/17/2016 1618   BASOSABS 0.0 10/17/2016 1618    BMET    Component Value Date/Time   NA 138 10/21/2016 0404   K 3.3 (L) 10/21/2016 0404   CL 106 10/21/2016 0404   CO2 24 10/21/2016 0404   GLUCOSE 90 10/21/2016 0404   BUN 7 10/21/2016 0404   CREATININE 0.75 10/21/2016 0404   CALCIUM 7.9 (L) 10/21/2016 0404   GFRNONAA >60 10/21/2016 0404   GFRAA >60 10/21/2016 0404    BNP No results found for: "BNP"  ProBNP No results found for: "PROBNP"  Imaging: No results found.   Assessment & Plan:   No problem-specific Assessment & Plan notes found for this encounter.     Glenford Bayley, NP 12/09/2022

## 2022-12-09 NOTE — Patient Instructions (Addendum)
Sleep apnea is defined as period of 10 seconds or longer when you stop breathing at night. This can happen multiple times a night. Dx sleep apnea is when this occurs more than 5 times an hour.    Mild OSA 5-15 apneic events an hour Moderate OSA 15-30 apneic events an hour Severe OSA > 30 apneic events an hour   Untreated sleep apnea puts you at higher risk for cardiac arrhythmias, pulmonary HTN, stroke and diabetes   Treatment options include weight loss, side sleeping position, oral appliance, CPAP therapy or referral to ENT for possible surgical options    Recommendations: Focus on side sleeping position or elevate head with wedge pillow 30 degrees Work on weight loss efforts if able  Do not drive if experiencing excessive daytime sleepiness of fatigue    Orders: Home sleep study re: loud snoring (ordered)    Follow-up: Please call to schedule follow-up 1-2 weeks after completing home sleep study to review results and treatment if needed (can be virtual)  Sleep Apnea Sleep apnea affects breathing during sleep. It causes breathing to stop for 10 seconds or more, or to become shallow. People with sleep apnea usually snore loudly. It can also increase the risk of: Heart attack. Stroke. Being very overweight (obese). Diabetes. Heart failure. Irregular heartbeat. High blood pressure. The goal of treatment is to help you breathe normally again. What are the causes?  The most common cause of this condition is a collapsed or blocked airway. There are three kinds of sleep apnea: Obstructive sleep apnea. This is caused by a blocked or collapsed airway. Central sleep apnea. This happens when the brain does not send the right signals to the muscles that control breathing. Mixed sleep apnea. This is a combination of obstructive and central sleep apnea. What increases the risk? Being overweight. Smoking. Having a small airway. Being older. Being female. Drinking alcohol. Taking  medicines to calm yourself (sedatives or tranquilizers). Having family members with the condition. Having a tongue or tonsils that are larger than normal. What are the signs or symptoms? Trouble staying asleep. Loud snoring. Headaches in the morning. Waking up gasping. Dry mouth or sore throat in the morning. Being sleepy or tired during the day. If you are sleepy or tired during the day, you may also: Not be able to focus your mind (concentrate). Forget things. Get angry a lot and have mood swings. Feel sad (depressed). Have changes in your personality. Have less interest in sex, if you are female. Be unable to have an erection, if you are female. How is this treated?  Sleeping on your side. Using a medicine to get rid of mucus in your nose (decongestant). Avoiding the use of alcohol, medicines to help you relax, or certain pain medicines (narcotics). Losing weight, if needed. Changing your diet. Quitting smoking. Using a machine to open your airway while you sleep, such as: An oral appliance. This is a mouthpiece that shifts your lower jaw forward. A CPAP device. This device blows air through a mask when you breathe out (exhale). An EPAP device. This has valves that you put in each nostril. A BIPAP device. This device blows air through a mask when you breathe in (inhale) and breathe out. Having surgery if other treatments do not work. Follow these instructions at home: Lifestyle Make changes that your doctor recommends. Eat a healthy diet. Lose weight if needed. Avoid alcohol, medicines to help you relax, and some pain medicines. Do not smoke or use any products   that contain nicotine or tobacco. If you need help quitting, ask your doctor. General instructions Take over-the-counter and prescription medicines only as told by your doctor. If you were given a machine to use while you sleep, use it only as told by your doctor. If you are having surgery, make sure to tell your  doctor you have sleep apnea. You may need to bring your device with you. Keep all follow-up visits. Contact a doctor if: The machine that you were given to use during sleep bothers you or does not seem to be working. You do not get better. You get worse. Get help right away if: Your chest hurts. You have trouble breathing in enough air. You have an uncomfortable feeling in your back, arms, or stomach. You have trouble talking. One side of your body feels weak. A part of your face is hanging down. These symptoms may be an emergency. Get help right away. Call your local emergency services (911 in the [place] Do not wait to see if the symptoms will go away. Do not drive yourself to the hospital. Summary This condition affects breathing during sleep. The most common cause is a collapsed or blocked airway. The goal of treatment is to help you breathe normally while you sleep. This information is not intended to replace advice given to you by your health care provider. Make sure you discuss any questions you have with your health care provider. Document Revised: 10/07/2020 Document Reviewed: 02/07/2020 Elsevier Patient Education  2024 Elsevier Inc.   

## 2022-12-11 DIAGNOSIS — R0683 Snoring: Secondary | ICD-10-CM | POA: Insufficient documentation

## 2022-12-11 NOTE — Assessment & Plan Note (Signed)
-   Patient has symptoms of loud snoring and restless sleep.  No significant daytime sleepiness.  Epworth score 4.  Concern patient could have underlying obstructive sleep apnea, needs home sleep study evaluate.  We discussed risks of untreated sleep apnea including cardiac arrhythmias, pulmonary hypertension, diabetes and stroke.  We also discussed treatment options including weight loss, oral appliance, CPAP therapy referral to ENT for possible surgical options.  Encouraged weight loss efforts and focus on side sleeping position.  Advised against driving if experiencing excessive daytime sleepiness fatigue.  Follow-up 1 to 2 weeks after sleep study review results and treatment options if needed

## 2022-12-27 ENCOUNTER — Ambulatory Visit: Payer: Medicaid Other

## 2022-12-27 DIAGNOSIS — G4733 Obstructive sleep apnea (adult) (pediatric): Secondary | ICD-10-CM

## 2022-12-27 DIAGNOSIS — R0683 Snoring: Secondary | ICD-10-CM

## 2023-01-11 DIAGNOSIS — G4733 Obstructive sleep apnea (adult) (pediatric): Secondary | ICD-10-CM | POA: Diagnosis not present

## 2023-05-06 ENCOUNTER — Other Ambulatory Visit: Payer: Self-pay | Admitting: Physician Assistant

## 2023-05-06 DIAGNOSIS — Z1231 Encounter for screening mammogram for malignant neoplasm of breast: Secondary | ICD-10-CM

## 2023-05-17 ENCOUNTER — Ambulatory Visit
Admission: RE | Admit: 2023-05-17 | Discharge: 2023-05-17 | Discharge status: Home / Self Care | Source: Ambulatory Visit | Attending: Physician Assistant | Admitting: Physician Assistant

## 2023-05-17 DIAGNOSIS — Z1231 Encounter for screening mammogram for malignant neoplasm of breast: Secondary | ICD-10-CM

## 2023-06-12 ENCOUNTER — Encounter: Payer: Self-pay | Admitting: Primary Care

## 2023-06-12 ENCOUNTER — Ambulatory Visit: Payer: Medicaid Other | Admitting: Primary Care

## 2023-06-12 VITALS — BP 154/92 | HR 73 | Temp 97.3°F | Ht 64.0 in | Wt 249.2 lb

## 2023-06-12 DIAGNOSIS — E669 Obesity, unspecified: Secondary | ICD-10-CM | POA: Diagnosis not present

## 2023-06-12 DIAGNOSIS — Z6841 Body Mass Index (BMI) 40.0 and over, adult: Secondary | ICD-10-CM | POA: Diagnosis not present

## 2023-06-12 DIAGNOSIS — G4730 Sleep apnea, unspecified: Secondary | ICD-10-CM

## 2023-06-12 NOTE — Progress Notes (Signed)
 @Patient  ID: Jennifer Maldonado, female    DOB: 02-05-1976, 48 y.o.   MRN: [id number]  Chief Complaint  Patient presents with   Follow-up    F/U on HST results from October 2024    Referring provider: Norm Salt, PA  HPI:  48 year old female, never smoked.  Past medical history significant for HTN, acute kidney injury, UTI, sepsis, loud snoring.  06/12/2023 Discussed the use of AI scribe software for clinical note transcription with the patient, who gave verbal consent to proceed.  History of Present Illness   Jennifer Maldonado is a 48 year old female who presents for a follow-up on her sleep condition and weight management.  She experiences ongoing sleep disturbances, including snoring, restless sleep, and morning headaches. She typically sleeps four to six hours per night and feels tired during the day but does not fall asleep when inactive. Others have observed that she stops breathing during sleep. Her bedtime is between 8 and 10 PM, and she wakes twice a night to use the restroom, starting her day at 5 AM. She typically sleeps on her side.  A sleep study conducted on October 15th revealed mild sleep apnea with 31 apneic or hypopneic events, averaging 8.9 events per hour. Her average oxygen saturation was 93%, with a low of 83%, and she spent 38 minutes with oxygen levels below 90%.  She has lost 20 pounds, with her current weight at 249 pounds, down from a high of 275 pounds. Her BMI is 42, indicating obesity. She wants to lose weight but struggles with consistency in her weight loss efforts.  Current smoker. Alcohol consumption before bed affects her sleep quality, and she can tell the difference in her sleep when she wakes up.       No Known Allergies   There is no immunization history on file for this patient.  Past Medical History:  Diagnosis Date   Hypertension     Tobacco History: Social History   Tobacco Use  Smoking Status Every Day   Types:  Cigarettes  Smokeless Tobacco Never  Tobacco Comments   6 Cigarettes a day. AB, CMA 06-12-23   Ready to quit: Not Answered Counseling given: Not Answered Tobacco comments: 6 Cigarettes a day. AB, CMA 06-12-23   Outpatient Medications Prior to Visit  Medication Sig Dispense Refill   amLODipine (NORVASC) 10 MG tablet Take 1 tablet (10 mg total) by mouth daily. 30 tablet 1   cetirizine (ZYRTEC) 10 MG tablet Take 10 mg by mouth daily as needed for allergies.   1   meclizine (ANTIVERT) 25 MG tablet Take 25 mg by mouth 2 (two) times daily as needed.     naproxen sodium (ALEVE) 220 MG tablet Take 440 mg by mouth 2 (two) times daily as needed (for pain).      buPROPion (WELLBUTRIN SR) 200 MG 12 hr tablet Take 200 mg by mouth 2 (two) times daily. (Patient not taking: Reported on 12/09/2022)  3   furosemide (LASIX) 20 MG tablet Take 1 tablet (20 mg total) by mouth daily. (Patient not taking: Reported on 06/12/2023) 3 tablet 0   Potassium Chloride ER 20 MEQ TBCR Take 20 mEq by mouth daily. (Patient not taking: Reported on 06/12/2023) 3 tablet 0   No facility-administered medications prior to visit.   Review of Systems  Review of Systems  Constitutional: Negative.   HENT: Negative.    Respiratory: Negative.    Cardiovascular: Negative.      Physical Exam  BP (!) 154/92 (BP Location: Left Arm, Patient Position: Sitting, Cuff Size: Large)   Pulse 73   Temp (!) 97.3 F (36.3 C) (Temporal)   Ht 5\' 4"  (1.626 m)   Wt 249 lb 3.2 oz (113 kg)   LMP 04/28/2023 (Approximate)   SpO2 99%   BMI 42.78 kg/m  Physical Exam Constitutional:      General: She is not in acute distress.    Appearance: Normal appearance. She is obese. She is not ill-appearing.  HENT:     Mouth/Throat:     Mouth: Mucous membranes are moist.     Pharynx: Oropharynx is clear.  Cardiovascular:     Rate and Rhythm: Normal rate and regular rhythm.  Pulmonary:     Effort: Pulmonary effort is normal.     Breath sounds:  Normal breath sounds. No wheezing, rhonchi or rales.  Skin:    General: Skin is warm and dry.  Neurological:     General: No focal deficit present.     Mental Status: She is alert and oriented to person, place, and time. Mental status is at baseline.  Psychiatric:        Mood and Affect: Mood normal.        Behavior: Behavior normal.        Thought Content: Thought content normal.        Judgment: Judgment normal.      Lab Results:  CBC    Component Value Date/Time   WBC 11.0 (H) 10/21/2016 0404   RBC 3.74 (L) 10/21/2016 0404   HGB 8.5 (L) 10/21/2016 0404   HCT 26.4 (L) 10/21/2016 0404   PLT 388 10/21/2016 0404   MCV 70.6 (L) 10/21/2016 0404   MCH 22.7 (L) 10/21/2016 0404   MCHC 32.2 10/21/2016 0404   RDW 17.2 (H) 10/21/2016 0404   LYMPHSABS 1.0 10/17/2016 1618   MONOABS 2.2 (H) 10/17/2016 1618   EOSABS 0.0 10/17/2016 1618   BASOSABS 0.0 10/17/2016 1618    BMET    Component Value Date/Time   NA 138 10/21/2016 0404   K 3.3 (L) 10/21/2016 0404   CL 106 10/21/2016 0404   CO2 24 10/21/2016 0404   GLUCOSE 90 10/21/2016 0404   BUN 7 10/21/2016 0404   CREATININE 0.75 10/21/2016 0404   CALCIUM 7.9 (L) 10/21/2016 0404   GFRNONAA >60 10/21/2016 0404   GFRAA >60 10/21/2016 0404    BNP No results found for: "BNP"  ProBNP No results found for: "PROBNP"  Imaging: MM 3D SCREENING MAMMOGRAM BILATERAL BREAST Result Date: 05/22/2023 CLINICAL DATA:  Screening. EXAM: DIGITAL SCREENING BILATERAL MAMMOGRAM WITH TOMOSYNTHESIS AND CAD TECHNIQUE: Bilateral screening digital craniocaudal and mediolateral oblique mammograms were obtained. Bilateral screening digital breast tomosynthesis was performed. The images were evaluated with computer-aided detection. COMPARISON:  None available. ACR Breast Density Category b: There are scattered areas of fibroglandular density. FINDINGS: There are no findings suspicious for malignancy. IMPRESSION: No mammographic evidence of malignancy. A result  letter of this screening mammogram will be mailed directly to the patient. RECOMMENDATION: Screening mammogram in one year. (Code:SM-B-01Y) BI-RADS CATEGORY  1: Negative. Electronically Signed   By: Emmaline Kluver M.D.   On: 05/22/2023 12:46     Assessment & Plan:   1. Mild sleep apnea (Primary)  2. 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 Symptoms include snoring, restless sleep, and morning headaches. Conservative management is appropriate due to mild severity. Risks of untreated mild sleep apnea include minimal risk for cardiac arrhythmia, stroke, pulmonary hypertension, and diabetes, which increase with severity. Treatment options include weight loss, oral appliance, CPAP, and ENT surgical evaluation. CPAP is standard for moderate to severe cases but can be prescribed for mild cases if symptomatic and open to it. - Recommend weight loss as primary management. - Advise side sleeping to reduce apneic events. - Discuss potential use of oral appliance for snoring and mild apneas, considering cost and insurance coverage. She would like to work on weight loss before considering other treatment options  - Advise against excessive alcohol consumption before bedtime to prevent worsening of sleep apnea. - Recommend avoiding driving if excessively tired. - Follow up in six months to assess progress and consider repeating sleep study if weight loss goals are achieved.  Obesity BMI is 42, indicating obesity. Weight is 249 pounds, with previous weight of 275 pounds. Obesity contributes to the severity of sleep apnea. She desires assistance with weight loss and has struggled with consistency. Weight loss of 15-20% may improve sleep apnea. GLP-1 medication (Zepbound or tirzepatide) is an option, but insurance coverage and cost  are a potential barrier  - Advised patient discuss GLP-1 medication (Zepbound or tirzepatide) with primary care provider for weight management. - Document sleep study and BMI to support potential prescription of weight loss medication. - Suggest checking with insurance for coverage details and exploring compounding pharmacy options for affordability.  Follow-up Plan to reassess condition and progress in weight loss and sleep apnea management. - Schedule follow-up appointment in six months to evaluate weight loss progress and consider repeating sleep study.      Glenford Bayley, NP 06/12/2023

## 2023-06-12 NOTE — Patient Instructions (Addendum)
 -OBSTRUCTIVE SLEEP APNEA (OSA): You have mild obstructive sleep apnea, which means you experience brief interruptions in breathing during sleep. To manage this, we recommend focusing on weight loss, sleeping on your side, and avoiding alcohol before bedtime. We also discussed the potential use of an oral appliance to help with snoring and mild apneas. Please avoid driving if you feel excessively tired. We will follow up in six months to assess your progress and may repeat the sleep study if you achieve your weight loss goals.  -OBESITY: Your current weight is 249 pounds, with a BMI of 42, which indicates obesity. Losing weight can help improve your sleep apnea. We discussed the option of using a GLP-1 medication like Zepbound or tirzepatide to assist with weight loss. Please contact your primary care provider to discuss this further and check with your insurance about coverage. If not covered, the medication costs around $400 per month, but compounding pharmacies may offer more affordable options.  -SMOKING: Smoking can worsen sleep apnea and other health conditions. Quitting smoking can improve your overall health and help manage your sleep apnea. We recommend seeking support to quit smoking.  INSTRUCTIONS: Please schedule a follow-up appointment in six months to evaluate your weight loss progress and consider repeating the sleep study. Contact your primary care provider via MyChart or phone to discuss weight loss medication options and check with your insurance for coverage details.  Follow-up 6 months with [facility] NP or sooner if needed    Sleep Apnea  Sleep apnea is a condition that affects your breathing while you are sleeping. Your tongue or soft tissue in your throat may block the flow of air while you sleep. You may have shallow breathing or stop breathing for short periods of time. People with sleep apnea may snore loudly. There are three kinds of sleep apnea: Obstructive sleep apnea.  This kind is caused by a blocked or collapsed airway. This is the most common. Central sleep apnea. This kind happens when the part of the brain that controls breathing does not send the correct signals to the muscles that control breathing. Mixed sleep apnea. This is a combination of obstructive and central sleep apnea. What are the causes? The most common cause of sleep apnea is a collapsed or blocked airway. What increases the risk? Being very overweight. Having family members with sleep apnea. Having a tongue or tonsils that are larger than normal. Having a small airway or jaw problems. Being older. What are the signs or symptoms? Loud snoring. Restless sleep. Trouble staying asleep. Being sleepy or tired during the day. Waking up gasping or choking. Having a headache in the morning. Mood swings. Having a hard time remembering things and concentrating. How is this diagnosed? A medical history. A physical exam. A sleep study. This is also called a polysomnography test. This test is done at a sleep lab or in your home while you are sleeping. How is this treated? Treatment may include: Sleeping on your side. Losing weight if you're overweight. Wearing an oral appliance. This is a mouthpiece that moves your lower jaw forward. Using a positive airway pressure (PAP) device to keep your airways open while you sleep, such as: A continuous positive airway pressure (CPAP) device. This device gives forced air through a mask when you breathe out. This keeps your airways open. A bilevel positive airway pressure (BIPAP) device. This device gives forced air through a mask when you breathe in and when you breathe out to keep your airways open.  Having surgery if other treatments do not work. If your sleep apnea is not treated, you may be at risk for: Heart failure. Heart attack. Stroke. Type 2 diabetes or a problem with your blood sugar called insulin resistance. Follow these instructions at  home: Medicines Take your medicines only as told by your health care provider. Avoid alcohol, medicines to help you relax, and certain pain medicines. These may make sleep apnea worse. General instructions Do not smoke, vape, or use products with nicotine or tobacco in them. If you need help quitting, talk with your provider. If you were given a PAP device to open your airway while you sleep, use it as told by your provider. If you're having surgery, make sure to tell your provider you have sleep apnea. You may need to bring your PAP device with you. Contact a health care provider if: The PAP device that you were given to use during sleep bothers you or does not seem to be working. You do not feel better or you feel worse. Get help right away if: You have trouble breathing. You have chest pain. You have trouble talking. One side of your body feels weak. A part of your face is hanging down. These symptoms may be an emergency. Call 911 right away. Do not wait to see if the symptoms will go away. Do not drive yourself to the hospital. This information is not intended to replace advice given to you by your health care provider. Make sure you discuss any questions you have with your health care provider. Document Revised: 12/01/2022 Document Reviewed: 05/05/2022 Elsevier Patient Education  2024 [employer].

## 2023-12-11 ENCOUNTER — Ambulatory Visit: Admitting: Primary Care

## 2023-12-11 DIAGNOSIS — G4730 Sleep apnea, unspecified: Secondary | ICD-10-CM

## 2023-12-11 NOTE — Progress Notes (Deleted)
 @Patient  ID: Jennifer Maldonado, female    DOB: 03/24/1975, 48 y.o.   MRN: [id number]  No chief complaint on file.   Referring provider: Rosalea Rosina SAILOR, PA  HPI: 48 year old female, never smoked.  Past medical history significant for HTN, acute kidney injury, UTI, sepsis, loud snoring.  Previous [facility] pulmonary encounter:  06/12/2023 Discussed the use of AI scribe software for clinical note transcription with the patient, who gave verbal consent to proceed.  History of Present Illness   Jennifer Maldonado is a 48 year old female who presents for a follow-up on her sleep condition and weight management.  She experiences ongoing sleep disturbances, including snoring, restless sleep, and morning headaches. She typically sleeps four to six hours per night and feels tired during the day but does not fall asleep when inactive. Others have observed that she stops breathing during sleep. Her bedtime is between 8 and 10 PM, and she wakes twice a night to use the restroom, starting her day at 5 AM. She typically sleeps on her side.  A sleep study conducted on October 15th revealed mild sleep apnea with 31 apneic or hypopneic events, averaging 8.9 events per hour. Her average oxygen saturation was 93%, with a low of 83%, and she spent 38 minutes with oxygen levels below 90%.  She has lost 20 pounds, with her current weight at 249 pounds, down from a high of 275 pounds. Her BMI is 42, indicating obesity. She wants to lose weight but struggles with consistency in her weight loss efforts.  Current smoker. Alcohol consumption before bed affects her sleep quality, and she can tell the difference in her sleep when she wakes up.       12/11/2023- Interim hx  Discussed the use of AI scribe software for clinical note transcription with the patient, who gave verbal consent to proceed.  History of Present Illness      HST showed mild OSA with average AHI 8.9/hour  Patient elected to work on weight  loss Options oral appliance or CPAP  BMI 42, Discuss GLP medication with PCP    No Known Allergies   There is no immunization history on file for this patient.  Past Medical History:  Diagnosis Date   Hypertension     Tobacco History: Social History   Tobacco Use  Smoking Status Every Day   Types: Cigarettes  Smokeless Tobacco Never  Tobacco Comments   6 Cigarettes a day. AB, CMA 06-12-23   Ready to quit: Not Answered Counseling given: Not Answered Tobacco comments: 6 Cigarettes a day. AB, CMA 06-12-23   Outpatient Medications Prior to Visit  Medication Sig Dispense Refill   amLODipine  (NORVASC ) 10 MG tablet Take 1 tablet (10 mg total) by mouth daily. 30 tablet 1   cetirizine (ZYRTEC) 10 MG tablet Take 10 mg by mouth daily as needed for allergies.   1   meclizine (ANTIVERT) 25 MG tablet Take 25 mg by mouth 2 (two) times daily as needed.     naproxen sodium (ALEVE) 220 MG tablet Take 440 mg by mouth 2 (two) times daily as needed (for pain).      No facility-administered medications prior to visit.      Review of Systems  Review of Systems   Physical Exam  There were no vitals taken for this visit. Physical Exam  ***  Lab Results:  CBC    Component Value Date/Time   WBC 11.0 (H) 10/21/2016 0404   RBC 3.74 (L) 10/21/2016  0404   HGB 8.5 (L) 10/21/2016 0404   HCT 26.4 (L) 10/21/2016 0404   PLT 388 10/21/2016 0404   MCV 70.6 (L) 10/21/2016 0404   MCH 22.7 (L) 10/21/2016 0404   MCHC 32.2 10/21/2016 0404   RDW 17.2 (H) 10/21/2016 0404   LYMPHSABS 1.0 10/17/2016 1618   MONOABS 2.2 (H) 10/17/2016 1618   EOSABS 0.0 10/17/2016 1618   BASOSABS 0.0 10/17/2016 1618    BMET    Component Value Date/Time   NA 138 10/21/2016 0404   K 3.3 (L) 10/21/2016 0404   CL 106 10/21/2016 0404   CO2 24 10/21/2016 0404   GLUCOSE 90 10/21/2016 0404   BUN 7 10/21/2016 0404   CREATININE 0.75 10/21/2016 0404   CALCIUM 7.9 (L) 10/21/2016 0404   GFRNONAA >60 10/21/2016  0404   GFRAA >60 10/21/2016 0404    BNP No results found for: BNP  ProBNP No results found for: PROBNP  Imaging: No results found.   Assessment & Plan:   No problem-specific Assessment & Plan notes found for this encounter.   1. Mild sleep apnea (Primary)   Assessment and Plan Assessment & Plan        Almarie LELON Ferrari, NP 12/11/2023
